# Patient Record
Sex: Female | Born: 1960
Health system: Southern US, Community
[De-identification: ages and names within clinical notes are randomized; demographics above are authoritative.]

## PROBLEM LIST (undated history)

## (undated) DIAGNOSIS — J310 Chronic rhinitis: Secondary | ICD-10-CM

## (undated) DIAGNOSIS — H548 Legal blindness, as defined in USA: Secondary | ICD-10-CM

## (undated) DIAGNOSIS — R51 Headache: Secondary | ICD-10-CM

## (undated) DIAGNOSIS — E7800 Pure hypercholesterolemia, unspecified: Secondary | ICD-10-CM

## (undated) DIAGNOSIS — G911 Obstructive hydrocephalus: Secondary | ICD-10-CM

## (undated) DIAGNOSIS — I10 Essential (primary) hypertension: Secondary | ICD-10-CM

## (undated) HISTORY — DX: Legal blindness, as defined in USA: H54.8

## (undated) HISTORY — DX: Chronic rhinitis: J31.0

## (undated) HISTORY — DX: Obstructive hydrocephalus: G91.1

## (undated) HISTORY — PX: ROTATOR CUFF REPAIR: SHX139

## (undated) HISTORY — PX: ANKLE FUSION: SHX881

## (undated) HISTORY — DX: Pure hypercholesterolemia, unspecified: E78.00

## (undated) HISTORY — DX: Essential (primary) hypertension: I10

## (undated) HISTORY — PX: OTHER SURGICAL HISTORY: SHX169

## (undated) HISTORY — DX: Headache: R51

---

## 1998-08-30 ENCOUNTER — Emergency Department (HOSPITAL_COMMUNITY): Admission: EM | Admit: 1998-08-30 | Discharge: 1998-08-30 | Payer: Self-pay | Admitting: Emergency Medicine

## 2002-01-26 ENCOUNTER — Emergency Department (HOSPITAL_COMMUNITY): Admission: EM | Admit: 2002-01-26 | Discharge: 2002-01-26 | Payer: Self-pay | Admitting: Emergency Medicine

## 2002-01-26 ENCOUNTER — Encounter: Payer: Self-pay | Admitting: Emergency Medicine

## 2006-09-13 ENCOUNTER — Emergency Department (HOSPITAL_COMMUNITY): Admission: EM | Admit: 2006-09-13 | Discharge: 2006-09-14 | Payer: Self-pay | Admitting: Emergency Medicine

## 2007-08-03 ENCOUNTER — Encounter: Admission: RE | Admit: 2007-08-03 | Discharge: 2007-08-03 | Payer: Self-pay | Admitting: Orthopedic Surgery

## 2007-09-08 ENCOUNTER — Inpatient Hospital Stay (HOSPITAL_COMMUNITY): Admission: RE | Admit: 2007-09-08 | Discharge: 2007-09-11 | Payer: Self-pay | Admitting: Orthopedic Surgery

## 2008-02-10 ENCOUNTER — Encounter: Admission: RE | Admit: 2008-02-10 | Discharge: 2008-02-10 | Payer: Self-pay | Admitting: Family Medicine

## 2008-02-22 ENCOUNTER — Encounter: Admission: RE | Admit: 2008-02-22 | Discharge: 2008-05-22 | Payer: Self-pay | Admitting: Family Medicine

## 2009-04-10 ENCOUNTER — Encounter: Admission: RE | Admit: 2009-04-10 | Discharge: 2009-04-10 | Payer: Self-pay | Admitting: Family Medicine

## 2009-04-15 ENCOUNTER — Encounter: Admission: RE | Admit: 2009-04-15 | Discharge: 2009-04-15 | Payer: Self-pay | Admitting: Family Medicine

## 2009-04-17 ENCOUNTER — Other Ambulatory Visit: Admission: RE | Admit: 2009-04-17 | Discharge: 2009-04-17 | Payer: Self-pay | Admitting: Family Medicine

## 2009-10-18 ENCOUNTER — Encounter: Admission: RE | Admit: 2009-10-18 | Discharge: 2009-10-18 | Payer: Self-pay | Admitting: Family Medicine

## 2009-11-04 ENCOUNTER — Encounter: Admission: RE | Admit: 2009-11-04 | Discharge: 2009-11-04 | Payer: Self-pay | Admitting: Otolaryngology

## 2010-02-16 ENCOUNTER — Encounter
Admission: RE | Admit: 2010-02-16 | Discharge: 2010-02-16 | Payer: Self-pay | Source: Home / Self Care | Attending: Orthopedic Surgery | Admitting: Orthopedic Surgery

## 2010-02-23 ENCOUNTER — Encounter: Payer: Self-pay | Admitting: Otolaryngology

## 2010-02-24 ENCOUNTER — Other Ambulatory Visit: Payer: Self-pay | Admitting: Family Medicine

## 2010-02-24 ENCOUNTER — Encounter: Payer: Self-pay | Admitting: Orthopedic Surgery

## 2010-02-24 DIAGNOSIS — N6320 Unspecified lump in the left breast, unspecified quadrant: Secondary | ICD-10-CM

## 2010-03-19 ENCOUNTER — Ambulatory Visit (HOSPITAL_BASED_OUTPATIENT_CLINIC_OR_DEPARTMENT_OTHER)
Admission: RE | Admit: 2010-03-19 | Discharge: 2010-03-19 | Disposition: A | Discharge status: Home / Self Care | Payer: Medicare Other | Source: Ambulatory Visit | Attending: Orthopedic Surgery | Admitting: Orthopedic Surgery

## 2010-03-19 DIAGNOSIS — Z01812 Encounter for preprocedural laboratory examination: Secondary | ICD-10-CM | POA: Insufficient documentation

## 2010-03-19 DIAGNOSIS — M719 Bursopathy, unspecified: Secondary | ICD-10-CM | POA: Insufficient documentation

## 2010-03-19 DIAGNOSIS — M24119 Other articular cartilage disorders, unspecified shoulder: Secondary | ICD-10-CM | POA: Insufficient documentation

## 2010-03-19 DIAGNOSIS — M19019 Primary osteoarthritis, unspecified shoulder: Secondary | ICD-10-CM | POA: Insufficient documentation

## 2010-03-19 DIAGNOSIS — Z01810 Encounter for preprocedural cardiovascular examination: Secondary | ICD-10-CM | POA: Insufficient documentation

## 2010-03-19 DIAGNOSIS — M67919 Unspecified disorder of synovium and tendon, unspecified shoulder: Secondary | ICD-10-CM | POA: Insufficient documentation

## 2010-03-21 NOTE — Op Note (Signed)
Stacy Hurst, Stacy Hurst              ACCOUNT NO.:  [id number]  MEDICAL RECORD NO.:  [id number]           PATIENT TYPE:  AME  LOCATION:                               FACILITY:  [facility]  PHYSICIAN:  Vara Mairena L. Rendall, M.D.  DATE OF BIRTH:  07/18/1960  DATE OF PROCEDURE:  03/19/2010 DATE OF DISCHARGE:                              OPERATIVE REPORT   PREOPERATIVE DIAGNOSES: 1. Right shoulder rotator cuff tear with downward sloping acromion, AC     joint arthritis, and degenerative labrum. 2. Morbid obesity.  SURGICAL PROCEDURE: 1. Arthroscopic glenohumeral debridement of labrum and portions of     cuff tears. 2. Open acromioplasty and repair of rotator cuff tear with also open     distal clavicle resection.  POSTOPERATIVE DIAGNOSES: 1. Right shoulder rotator cuff tear with downward sloping acromion, AC     joint arthritis, and degenerative labrum. 2. Morbid obesity.  SURGEON:  Soyla Bainter L. Rendall, MD  ASSISTANT:  Legrand Pitts. Duffy, PAC, present and participated in entire procedure.  PATHOLOGY:  The patient is morbidly obese, weighing 330 pounds at 5 feet 3 inches.  The weight of the arm and the fat between skin and joint was a significant obstacle throughout the procedure.  At surgery, the findings were a supraspinatus tear at its insertion with seriously downward sloping acromion.  She had poor muscle definition and weak deltoid and weak supraspinatus.  She had a supraspinatus outlet that was seriously impinged by the [facility] joint on both the acromial and the clavicular side.  Even after the acromioplasty and after the distal clavicle resection, it was necessary to bur a quarter inch of acromion in the region of the [facility] joint to be able to stick my assistants small finger in the interval between the supraspinatus and the acromion.  PROCEDURE IN DETAIL:  Under general anesthesia, the patient is placed semi-sitting on the regular OR table with a bolster under the right shoulder.  Because of  her size, it was necessary to do the shoulder scope in a sitting position, aiming the scope upward.  After routine prepping and draping, findings were degenerative labrum, tears of the supraspinatus, and also partial fraying of the subscapularis.  An anterior working portal was obtained and the labrum was debrided bit of the biceps tendon which was frayed, was debrided, and some of the supraspinatus and subscapularis were debrided.  Once this was completed, attention was turned to the rotator cuff tear.  An approximate 3 inch incision was made and dissection was carried down 2 inches to the fascia.  Two cerebellars were required.  The anterior leading edge of the acromion was not easily identified and the deltoid was taken down on the lateral two-thirds of the acromion, starting at the anterior portion.  Then, it went around the front an additional centimeter.  With this working space, an acromioplasty was carried out with osteotome and mallet.  The [facility] joint was found to be severely obstructing the supraspinatus notch.  It was necessary to proceed with the distal clavicle resection.  This was done by dissection bluntly over the fascia to the [facility] joint.  This was opened by subperiosteal dissection and 1 cm of the distal clavicle was removed with oscillating saw.  Bone wax was applied over this.  The fascia was then reapproximated with 0 Vicryl sutures closing the periosteal sleeve dorsally.  At this point, there was still significant obstruction of the supraspinatus and a 6 mm arthroscopy bur and its sheath was used to remove about a quarter inch of bone on the undersurface of the acromion near the [facility] joint.  This opened enough space for the tendon to traverse relatively easily.  Once this was completed, the rotator cuff was addressed, a bleeding bone bed was obtained at the greater tuberosity with half-inch osteotome and mallet.  #2 FiberWire was used and 2 stitches through bone  tunnels pulling the margins of the freshened bone to the bleeding bone bed, one was done anteriorly and an additional one posteriorly, and then a tweener suture as well.  This gave an excellent virtually watertight repair of the rotator cuff.  Once this was completed, the deltoid was re- attached to the acromion with #2 Ethibond sutures.  There was some difficulty in the stitch actually staying in the fascia of the deltoid as it was weak tissue.  However, by grabbing larger sections, the deltoid was re-attached through drill holes in bone made with towel clips.  With the deltoid reapproximated, the subcutaneous was closed in 2 layers, and the skin with Steri-Strips.  Operative time less than an hour and 30 minutes.  The patient tolerated the procedure well, but the procedure was made more difficult with constant struggling with the weight and position of the arm during key portions of the case.     Antoninette Lerner L. Priscille Kluver, M.D.     Renato Gails  D:  03/19/2010  T:  03/19/2010  Job:  [id number]  Electronically Signed by Erasmo Leventhal M.D. on 03/21/2010 01:23:51 PM

## 2010-06-17 NOTE — Op Note (Signed)
Stacy Hurst, Stacy Hurst              ACCOUNT NO.:  [id number]   MEDICAL RECORD NO.:  [id number]          PATIENT TYPE:  INP   LOCATION:  5033                         FACILITY:  [facility]   PHYSICIAN:  Nadara Mustard, MD     DATE OF BIRTH:  09/10/1960   DATE OF PROCEDURE:  09/08/2007  DATE OF DISCHARGE:                               OPERATIVE REPORT   PREOPERATIVE DIAGNOSIS:  Fibrous union, right ankle fusion and subtalar  arthrosis.   POSTOPERATIVE DIAGNOSIS:  Fibrous union, right ankle fusion and subtalar  arthrosis.   PROCEDURE:  1. Removal of deep retained hardware.  2. Tibiocalcaneal fusion with a Synthes nail 10 x 180 mm locked      distally.   SURGEON:  Nadara Mustard, MD   ANESTHESIA:  General plus popliteal block.   ESTIMATED BLOOD LOSS:  300 mL   ANTIBIOTICS:  Kefzol 2 grams.   DRAINS:  None.   COMPLICATIONS:  None.   TOURNIQUET TIME:  None.   DISPOSITION:  To PACU in stable condition.   INDICATIONS FOR PROCEDURE:  The patient is a 50 year old woman who is  status post right ankle fusion for osteoarthritis.  The patient had  developed a progressive fibrous union and had developed further  arthritis in the subtalar joint due to her progressive fibrous union and  subtalar arthrosis.  The patient presents at this time for  tibiocalcaneal fusion.  Risks and benefits were discussed including  infection, neurovascular injury, persistent pain, and need for  additional surgery.  The patient states she understands and wished to  proceed at this time.   DESCRIPTION OF PROCEDURE:  The patient was brought to OR room 1 and  under underwent a popliteal and general anesthetic.  After adequate  level of anesthesia was obtained, the patient was placed in the left  lateral decubitus position with the right side up and the right lower  extremity was prepped using DuraPrep and draped into a sterile field.  A  incision was made medially longitudinally over the medial malleolus.  This was carried down to the retained hardware and the deep retained  hardware was removed.  A separate incision was made laterally along the  fibula.  The fibular distal aspect was removed.  The foot was held at 90  degrees of dorsiflexion and cuts were made at the distal tibia and talar  dome to ensure 90-degree alignment of the ankle.  The subtalar joint was  debrided.  A separate incision was then made in the plantar aspect and a  guidewire was inserted from the calcaneus into the tibia.  The distal  calcaneus was reamed.  A guidewire was inserted from the calcaneus into  the tibial shaft and this was sequentially reamed to 11 mm for a 10-mm  nail.  The 10-mm nail was inserted.  C-arm fluoroscopy verified  reduction.  The spiral blade was attempted to be inserted distally,  however, the new external alignment jig did not provide proper  alignment.  So therefore, the spiral blade was not inserted and a 6 mm x  68 mm distal interlocking  screw was used to lock the nail.  This had  good purchase.  C-arm fluoroscopy verified reduction.  The alignment was  checked.  There was no internal or external rotation of the foot on the  tibia.  The wounds were irrigated.  The bone graft from the fibula was  morselized and was packed locally along the ankle and subtalar joint.  The incisions were closed using 2-0 nylon.  The wounds were covered with  Adaptic, orthopedic sponges, ABD dressing, Webril, and Coban.  The  patient was extubated and taken to PACU in stable condition.      Nadara Mustard, MD  Electronically Signed     MVD/MEDQ  D:  09/08/2007  T:  09/08/2007  Job:  [id number]

## 2010-06-20 NOTE — Discharge Summary (Signed)
NAMEDORCUS, RIGA              ACCOUNT NO.:  [id number]   MEDICAL RECORD NO.:  [id number]          PATIENT TYPE:  INP   LOCATION:  5033                         FACILITY:  [facility]   PHYSICIAN:  Nadara Mustard, MD     DATE OF BIRTH:  01-Nov-1960   DATE OF ADMISSION:  09/08/2007  DATE OF DISCHARGE:  09/11/2007                               DISCHARGE SUMMARY   FINAL DIAGNOSIS:  Fibrous union right ankle and subtalar fusion.   PROCEDURE:  Removal of deep hardware with tibial calcaneal fusion with a  Synthes nail.  Discharged to home in stable condition.  Touch down  weight bearing on the right.  Follow up with Dr. Lajoyce Corners in 1 week.   HISTORY OF PRESENT ILLNESS:  The patient is a 50 year old woman, status  post tibial calcaneal fusion, developed a fibrous union.  The patient  has had pain with activities of daily living and presents at this time  for revision surgery.  The patient's hospital course is essentially  unremarkable.  She underwent removal of deep hardware and tibial  calcaneal fusion on September 08, 2007.  She received Kefzol for infection  prophylaxis.  A tourniquet was not used.  She progressed well with her  physical therapy.  She received Kefzol for infection prophylaxis.  Her  foot was neurovascularly intact and she was discharged to home in stable  condition on September 11, 2007, with follow up in the office in 1 week.      Nadara Mustard, MD  Electronically Signed     MVD/MEDQ  D:  10/06/2007  T:  10/06/2007  Job:  [id number]

## 2010-08-18 ENCOUNTER — Ambulatory Visit
Admission: RE | Admit: 2010-08-18 | Discharge: 2010-08-18 | Disposition: A | Discharge status: Home / Self Care | Payer: Medicare Other | Source: Ambulatory Visit | Attending: Family Medicine | Admitting: Family Medicine

## 2010-08-18 DIAGNOSIS — N6320 Unspecified lump in the left breast, unspecified quadrant: Secondary | ICD-10-CM

## 2010-10-31 LAB — COMPREHENSIVE METABOLIC PANEL
CO2: 30
Calcium: 9.8
Creatinine, Ser: 0.88
GFR calc non Af Amer: >60
Glucose, Bld: 112 — ABNORMAL HIGH

## 2010-10-31 LAB — CBC
Hemoglobin: 13.4
MCHC: 33.6
MCV: 90.9
RBC: 4.38

## 2011-08-12 ENCOUNTER — Other Ambulatory Visit: Payer: Self-pay | Admitting: Family Medicine

## 2011-08-12 DIAGNOSIS — Z1231 Encounter for screening mammogram for malignant neoplasm of breast: Secondary | ICD-10-CM

## 2011-08-26 ENCOUNTER — Ambulatory Visit
Admission: RE | Admit: 2011-08-26 | Discharge: 2011-08-26 | Disposition: A | Discharge status: Home / Self Care | Payer: Medicare Other | Source: Ambulatory Visit | Attending: Family Medicine | Admitting: Family Medicine

## 2011-08-26 DIAGNOSIS — Z1231 Encounter for screening mammogram for malignant neoplasm of breast: Secondary | ICD-10-CM

## 2012-05-31 ENCOUNTER — Other Ambulatory Visit (HOSPITAL_COMMUNITY)
Admission: RE | Admit: 2012-05-31 | Discharge: 2012-05-31 | Disposition: A | Discharge status: Home / Self Care | Payer: Medicare Other | Source: Ambulatory Visit | Attending: Family Medicine | Admitting: Family Medicine

## 2012-05-31 ENCOUNTER — Other Ambulatory Visit: Payer: Self-pay | Admitting: Family Medicine

## 2012-05-31 DIAGNOSIS — Z1151 Encounter for screening for human papillomavirus (HPV): Secondary | ICD-10-CM | POA: Insufficient documentation

## 2012-05-31 DIAGNOSIS — Z124 Encounter for screening for malignant neoplasm of cervix: Secondary | ICD-10-CM | POA: Insufficient documentation

## 2012-07-15 ENCOUNTER — Other Ambulatory Visit: Payer: Self-pay

## 2012-07-15 DIAGNOSIS — Z1231 Encounter for screening mammogram for malignant neoplasm of breast: Secondary | ICD-10-CM

## 2012-07-21 ENCOUNTER — Other Ambulatory Visit: Payer: Self-pay

## 2012-07-21 ENCOUNTER — Ambulatory Visit
Admission: RE | Admit: 2012-07-21 | Discharge: 2012-07-21 | Disposition: A | Discharge status: Home / Self Care | Payer: Medicare Other | Source: Ambulatory Visit

## 2012-07-21 DIAGNOSIS — T1490XA Injury, unspecified, initial encounter: Secondary | ICD-10-CM

## 2012-07-21 DIAGNOSIS — R52 Pain, unspecified: Secondary | ICD-10-CM

## 2012-08-29 ENCOUNTER — Ambulatory Visit: Payer: Medicare Other

## 2012-09-12 ENCOUNTER — Ambulatory Visit
Admission: RE | Admit: 2012-09-12 | Discharge: 2012-09-12 | Disposition: A | Discharge status: Home / Self Care | Payer: Self-pay | Source: Ambulatory Visit

## 2012-09-12 DIAGNOSIS — Z1231 Encounter for screening mammogram for malignant neoplasm of breast: Secondary | ICD-10-CM

## 2013-03-16 ENCOUNTER — Ambulatory Visit
Admission: RE | Admit: 2013-03-16 | Discharge: 2013-03-16 | Disposition: A | Discharge status: Home / Self Care | Payer: Medicare Other | Source: Ambulatory Visit | Attending: Family Medicine | Admitting: Family Medicine

## 2013-03-16 ENCOUNTER — Other Ambulatory Visit: Payer: Self-pay | Admitting: Family Medicine

## 2013-03-16 DIAGNOSIS — J329 Chronic sinusitis, unspecified: Secondary | ICD-10-CM

## 2013-07-04 ENCOUNTER — Ambulatory Visit: Payer: Medicare Other | Admitting: Neurology

## 2013-07-04 ENCOUNTER — Encounter: Payer: Self-pay | Admitting: *Deleted

## 2013-07-05 ENCOUNTER — Encounter: Payer: Self-pay | Admitting: Neurology

## 2013-07-05 ENCOUNTER — Ambulatory Visit (INDEPENDENT_AMBULATORY_CARE_PROVIDER_SITE_OTHER): Payer: Medicare Other | Admitting: Neurology

## 2013-07-05 ENCOUNTER — Encounter (INDEPENDENT_AMBULATORY_CARE_PROVIDER_SITE_OTHER): Payer: Self-pay

## 2013-07-05 VITALS — BP 134/69 | HR 70 | Ht 64.5 in | Wt 331.0 lb

## 2013-07-05 DIAGNOSIS — G911 Obstructive hydrocephalus: Secondary | ICD-10-CM

## 2013-07-05 DIAGNOSIS — G43009 Migraine without aura, not intractable, without status migrainosus: Secondary | ICD-10-CM

## 2013-07-05 DIAGNOSIS — G919 Hydrocephalus, unspecified: Secondary | ICD-10-CM

## 2013-07-05 MED ORDER — TOPIRAMATE 25 MG PO TABS: 25.0000 mg | ORAL_TABLET | Freq: Two times a day (BID) | ORAL | Status: DC

## 2013-07-05 NOTE — Progress Notes (Signed)
[facility]    Provider:  Dr Janann Colonel Referring Provider: Jolene Provost, PA-C Primary Care Physician:  Lilian Coma, MD  CC:  headaches  HPI:  Stacy Hurst is a 53 y.o. female here as a referral from Dr. Sallee Provencal for frequent headaches  States she has had headaches her whole life. Was born with hydrocephalus, had shunt placed at 4yoa with revision in 1994 at [facility]. Shunt was last evaluated in 2014 and was working well. Headache is described as a severe pounding pain over her left side. Pain gets up to a 7/10 at its worst. Will have a headache like this 3 to 4 times a week. Can last 8 hours to all day long. + Nausea and emesis. + photophobia. No change in vision. No focal motor or sensory changes. + Dizziness and light headed sensation with the headaches. Thinks she may have been dizzy lately which resulted in a fall causing a fracture of her right foot. She notes being told she has papilledema in both eyes, this is a chronic problem. Has some difficulty with her vision, this is checked on a yearly basis and it has been stable. She reports her headaches have been ongoing for years, current headaches are not different from headaches in the past, does not feel headaches are getting progressively worse. Currently taking Atenolol daily for headache, uses Imitrex as needed, using <2-3 times per month.   Has been extensively evaluated by ENT for possible sinus headaches. They do not feel she is suffering from sinus headaches. No history of glaucoma or kidney stones.   Review of Systems: Out of a complete 14 system review, the patient complains of only the following symptoms, and all other reviewed systems are negative. + eye pain, feeling hot  History   Social History  . Marital Status: Single    Spouse Name: N/A    Number of Children: N/A  . Years of Education: N/A   Occupational History  . Not on file.   Social History Main Topics  . Smoking status:  Never Smoker   . Smokeless tobacco: Never Used  . Alcohol Use: No  . Drug Use: No  . Sexual Activity: Not on file   Other Topics Concern  . Not on file   Social History Narrative   Single, no children   Left handed   10 th grade   48 oz diet coke daily 16 oz tea occ    Family History  Problem Relation Age of Onset  . Hypertension Sister     Past Medical History  Diagnosis Date  . Unspecified essential hypertension   . Obstructive hydrocephalus   . Pure hypercholesterolemia   . Headache(784.0)   . Chronic rhinitis   . Legal blindness, as defined in [place]     No past surgical history on file.  Current Outpatient Prescriptions  Medication Sig Dispense Refill  . aspirin 81 MG tablet Take 81 mg by mouth daily.      Marland Kitchen atenolol (TENORMIN) 25 MG tablet Take 25 mg by mouth daily.      Marland Kitchen buPROPion (WELLBUTRIN XL) 300 MG 24 hr tablet Take 300 mg by mouth daily.      . cetirizine (ZYRTEC) 10 MG tablet Take 10 mg by mouth daily.      Marland Kitchen FLUoxetine (PROZAC) 40 MG capsule Take 40 mg by mouth daily.      . hydrochlorothiazide (HYDRODIURIL) 25 MG tablet Take 25 mg by mouth daily.      Marland Kitchen  mometasone (NASONEX) 50 MCG/ACT nasal spray Place 2 sprays into the nose daily.      . Olopatadine HCl (PATANASE) 0.6 % SOLN Place into the nose.      . pseudoephedrine (SUDAFED) 30 MG tablet Take 30 mg by mouth every 4 (four) hours as needed for congestion.      . rosuvastatin (CRESTOR) 10 MG tablet Take 10 mg by mouth daily.      . SUMAtriptan (IMITREX) 100 MG tablet Take 100 mg by mouth every 2 (two) hours as needed for migraine or headache. May repeat in 2 hours if headache persists or recurs.       No current facility-administered medications for this visit.    Allergies as of 07/05/2013 - Review Complete 07/05/2013  Allergen Reaction Noted  . Sulfa antibiotics  07/04/2013    Vitals: BP 134/69  Pulse 70  Ht 5' 4.5" (1.638 m)  Wt 331 lb (150.141 kg)  BMI 55.96 kg/m2 Last Weight:  Wt  Readings from Last 1 Encounters:  07/05/13 331 lb (150.141 kg)   Last Height:   Ht Readings from Last 1 Encounters:  07/05/13 5' 4.5" (1.638 m)     Physical exam: Exam: Gen: NAD, conversant Eyes: anicteric sclerae, moist conjunctivae HENT: Atraumatic, oropharynx clear Neck: Trachea midline; supple,  Lungs: CTA, no wheezing, rales, rhonic                          CV: RRR, no MRG Abdomen: Soft, non-tender;  Extremities: No peripheral edema  Skin: Normal temperature, no rash,  Psych: Appropriate affect, pleasant  Neuro: MS: AA&Ox3, appropriately interactive, normal affect   Attention: WORLD backwards  Speech: fluent w/o paraphasic error  Memory: good recent and remote recall  CN: PERRL, EOMI no nystagmus, mild bilateral optic disc edema, decreased VFF L inferior quadrant, no ptosis, sensation intact to LT V1-V3 bilat, face symmetric, no weakness, hearing grossly intact, palate elevates symmetrically, shoulder shrug 5/5 bilat,  tongue protrudes midline, no fasiculations noted.  Motor: normal bulk and tone Strength: 5/5  In all extremities  Coord: rapid alternating and point-to-point (FNF, HTS) movements intact.  Reflexes: symmetrical, bilat downgoing toes  Sens: LT intact in all extremities  Gait: posture, stance, stride and arm-swing normal. Tandem gait intact. Able to walk on heels and toes. Romberg absent.   Assessment:  After physical and neurologic examination, review of laboratory studies, imaging, neurophysiology testing and pre-existing records, assessment will be reviewed on the problem list.  Plan:  Treatment plan and additional workup will be reviewed under Problem List.  1)Headache 2)Hydrocephalus s/p VP shunt  53y/o woman presenting for initial evaluation of headache. Suspect her headaches are likely multifactorial but based on description she does appear to be suffering from migraine without aura. Will start Topamax 25mg  BID, can titrate up as  tolerated. She is scheduled to follow up with her eye doctor next week, unclear if her VF deficits are a chronic change. Have low suspicion that her hydrocephalus is contributing to her symptoms but counseled her to have routine follow up with her neurosurgeon to evaluate the shunt. Follow up as needed.   Jim Like, DO  [facility] [address] [place],  [address]  Phone [phone] Fax [phone]

## 2013-07-05 NOTE — Patient Instructions (Signed)
Overall you are doing fairly well but I do want to suggest a few things today:   Remember to drink plenty of fluid, eat healthy meals and do not skip any meals. Try to eat protein with a every meal and eat a healthy snack such as fruit or nuts in between meals. Try to keep a regular sleep-wake schedule and try to exercise daily, particularly in the form of walking, 20-30 minutes a day, if you can.   As far as your medications are concerned, I would like to suggest the following:  1)Please start taking Topamax 25mg  twice a day  Please follow up with your eye doctor and neurosurgeon to have the shunt evaluated.  Follow up as needed. Please call us with any interim questions, concerns, problems, updates or refill requests.   My clinical assistant and will answer any of your questions and relay your messages to me and also relay most of my messages to you.   Our phone number is [phone]. We also have an after hours call service for urgent matters and there is a physician on-call for urgent questions. For any emergencies you know to call 911 or go to the nearest emergency room

## 2013-07-12 ENCOUNTER — Ambulatory Visit: Payer: Medicare Other | Admitting: Neurology

## 2013-08-09 ENCOUNTER — Other Ambulatory Visit: Payer: Self-pay

## 2013-08-09 DIAGNOSIS — Z1231 Encounter for screening mammogram for malignant neoplasm of breast: Secondary | ICD-10-CM

## 2013-08-29 ENCOUNTER — Other Ambulatory Visit: Payer: Self-pay | Admitting: Family Medicine

## 2013-08-29 DIAGNOSIS — E041 Nontoxic single thyroid nodule: Secondary | ICD-10-CM

## 2013-08-31 ENCOUNTER — Emergency Department (HOSPITAL_COMMUNITY)
Admission: EM | Admit: 2013-08-31 | Discharge: 2013-08-31 | Disposition: A | Discharge status: Home / Self Care | Payer: Medicare Other | Attending: Emergency Medicine | Admitting: Emergency Medicine

## 2013-08-31 ENCOUNTER — Encounter (HOSPITAL_COMMUNITY): Payer: Self-pay | Admitting: Emergency Medicine

## 2013-08-31 ENCOUNTER — Emergency Department (HOSPITAL_COMMUNITY): Payer: Medicare Other

## 2013-08-31 DIAGNOSIS — IMO0002 Reserved for concepts with insufficient information to code with codable children: Secondary | ICD-10-CM | POA: Diagnosis not present

## 2013-08-31 DIAGNOSIS — H548 Legal blindness, as defined in USA: Secondary | ICD-10-CM | POA: Insufficient documentation

## 2013-08-31 DIAGNOSIS — R55 Syncope and collapse: Secondary | ICD-10-CM | POA: Diagnosis present

## 2013-08-31 DIAGNOSIS — R51 Headache: Secondary | ICD-10-CM | POA: Insufficient documentation

## 2013-08-31 DIAGNOSIS — Z79899 Other long term (current) drug therapy: Secondary | ICD-10-CM | POA: Insufficient documentation

## 2013-08-31 DIAGNOSIS — Z982 Presence of cerebrospinal fluid drainage device: Secondary | ICD-10-CM | POA: Insufficient documentation

## 2013-08-31 DIAGNOSIS — E7800 Pure hypercholesterolemia, unspecified: Secondary | ICD-10-CM | POA: Diagnosis not present

## 2013-08-31 DIAGNOSIS — I10 Essential (primary) hypertension: Secondary | ICD-10-CM | POA: Insufficient documentation

## 2013-08-31 DIAGNOSIS — R519 Headache, unspecified: Secondary | ICD-10-CM

## 2013-08-31 DIAGNOSIS — R200 Anesthesia of skin: Secondary | ICD-10-CM

## 2013-08-31 DIAGNOSIS — R209 Unspecified disturbances of skin sensation: Secondary | ICD-10-CM | POA: Insufficient documentation

## 2013-08-31 DIAGNOSIS — Z7982 Long term (current) use of aspirin: Secondary | ICD-10-CM | POA: Insufficient documentation

## 2013-08-31 LAB — BASIC METABOLIC PANEL
ANION GAP: 15 (ref 5–15)
BUN: 7 mg/dL (ref 6–23)
CALCIUM: 9.5 mg/dL (ref 8.4–10.5)
CO2: 24 meq/L (ref 19–32)
CREATININE: 0.89 mg/dL (ref 0.50–1.10)
Chloride: 103 mEq/L (ref 96–112)
GFR calc Af Amer: 86 mL/min — ABNORMAL LOW (ref >90)
GFR, EST NON AFRICAN AMERICAN: 74 mL/min — AB (ref >90)
Glucose, Bld: 95 mg/dL (ref 70–99)
Potassium: 3.7 mEq/L (ref 3.7–5.3)
SODIUM: 142 meq/L (ref 137–147)

## 2013-08-31 LAB — CBC
HCT: 38.9 % (ref 36.0–46.0)
Hemoglobin: 13.1 g/dL (ref 12.0–15.0)
MCH: 32.3 pg (ref 26.0–34.0)
MCHC: 33.7 g/dL (ref 30.0–36.0)
MCV: 95.8 fL (ref 78.0–100.0)
PLATELETS: 254 10*3/uL (ref 150–400)
RBC: 4.06 MIL/uL (ref 3.87–5.11)
RDW: 13.9 % (ref 11.5–15.5)
WBC: 7.4 10*3/uL (ref 4.0–10.5)

## 2013-08-31 LAB — I-STAT TROPONIN, ED: TROPONIN I, POC: 0 ng/mL (ref 0.00–0.08)

## 2013-08-31 MED ORDER — PROCHLORPERAZINE MALEATE 10 MG PO TABS: 10.0000 mg | ORAL_TABLET | Freq: Two times a day (BID) | ORAL | Status: AC | PRN

## 2013-08-31 MED ORDER — ONDANSETRON 4 MG PO TBDP: 4.0000 mg | ORAL_TABLET | Freq: Once | ORAL | Status: DC

## 2013-08-31 MED ORDER — PROCHLORPERAZINE EDISYLATE 5 MG/ML IJ SOLN
10.0000 mg | Freq: Four times a day (QID) | INTRAMUSCULAR | Status: DC | PRN
  Administered 2013-08-31: 10 mg via INTRAVENOUS
  Filled 2013-08-31: qty 2

## 2013-08-31 MED ORDER — SODIUM CHLORIDE 0.9 % IV SOLN
INTRAVENOUS | Status: DC
  Administered 2013-08-31: 75 mL/h via INTRAVENOUS

## 2013-08-31 MED ORDER — MORPHINE SULFATE 4 MG/ML IJ SOLN
4.0000 mg | Freq: Once | INTRAMUSCULAR | Status: DC
  Filled 2013-08-31: qty 1

## 2013-08-31 MED ORDER — PROMETHAZINE HCL 25 MG/ML IJ SOLN
25.0000 mg | Freq: Once | INTRAMUSCULAR | Status: AC
  Administered 2013-08-31: 25 mg via INTRAVENOUS
  Filled 2013-08-31: qty 1

## 2013-08-31 NOTE — ED Notes (Signed)
IV team paged; 2nd RN's attempts unsuccessful.

## 2013-08-31 NOTE — ED Notes (Signed)
Pt and family expressing concerns about being discharged. EDPA aware.

## 2013-08-31 NOTE — ED Notes (Signed)
IV team at bedside 

## 2013-08-31 NOTE — ED Notes (Signed)
EDPA student at bedside.

## 2013-08-31 NOTE — ED Notes (Signed)
Patient transported to CT 

## 2013-08-31 NOTE — ED Notes (Signed)
Pt states last Sunday was near syncopal.  Pt reports continuous dizziness, nausea, left arm/hand is numb.  Pt has shunt and on left sided. Reports pounding headache over left eye

## 2013-08-31 NOTE — ED Provider Notes (Signed)
CSN: [id number]     Arrival date & time 08/31/13  1559 History   First MD Initiated Contact with Patient 08/31/13 1751     Chief Complaint  Patient presents with  . Near Syncope  . Dizziness  . Hypotension     (Consider location/radiation/quality/duration/timing/severity/associated sxs/prior Treatment) HPI  Patient to the ER with complaints of near syncope episode this past Sunday (4 days ago) with continuous headache since then. She is also concerned about left hand numb and hand numbness. She has tremors at baseline. The patient was born with hydrocephalus and has had two shunts placed, the second time was in 1994. She has had no problems since then. She is having nausea, left sided hand pain and is concerned that her headaches are due to malfunction of her shunt. She has seen after her near syncope at an [facility] in [place]. Had a head CT done which was baseline. She saw her PCP today who told her to come to the ER for further evaluation because of concerns that she continues to have headaches and wanted her shunt re-evaluated. She has had no other episodes of near syncope. No fevers, vomiting, diarrhea, chest pain, coughing, sore throat, rash, vaginal discharge.  Past Medical History  Diagnosis Date  . Unspecified essential hypertension   . Obstructive hydrocephalus   . Pure hypercholesterolemia   . Headache(784.0)   . Chronic rhinitis   . Legal blindness, as defined in [place]    Past Surgical History  Procedure Laterality Date  . Shunt surgery    . Rotator cuff repair    . Ankle fusion     Family History  Problem Relation Age of Onset  . Hypertension Sister    History  Substance Use Topics  . Smoking status: Never Smoker   . Smokeless tobacco: Never Used  . Alcohol Use: No   OB History   Grav Para Term Preterm Abortions TAB SAB Ect Mult Living                 Review of Systems   Review of Systems  Gen: no weight loss, fevers, chills, night sweats  Eyes: no  discharge or drainage, no occular pain or visual changes  Nose: no epistaxis or rhinorrhea  Mouth: no dental pain, no sore throat  Neck: no neck pain  Lungs:No wheezing or hemoptysis No coughing CV:  No palpitations, dependent edema or orthopnea. No chest pain Abd: no diarrhea. No nausea or vomiting, No abdominal pain  GU: no dysuria or gross hematuria  MSK:  No muscle weakness, No  pain Neuro: + headache, no focal neurologic deficits, + near syncope Skin: no rash , no wounds Psyche: no complaints    Allergies  Sulfa antibiotics  Home Medications   Prior to Admission medications   Medication Sig Start Date End Date Taking? Authorizing Provider  aspirin 81 MG tablet Take 81 mg by mouth daily.   Yes Historical Provider, MD  atenolol (TENORMIN) 25 MG tablet Take 25 mg by mouth daily.   Yes Historical Provider, MD  buPROPion (WELLBUTRIN XL) 300 MG 24 hr tablet Take 300 mg by mouth daily.   Yes Historical Provider, MD  cetirizine (ZYRTEC) 10 MG tablet Take 10 mg by mouth daily.   Yes Historical Provider, MD  FLUoxetine (PROZAC) 40 MG capsule Take 40 mg by mouth daily.   Yes Historical Provider, MD  hydrochlorothiazide (HYDRODIURIL) 25 MG tablet Take 25 mg by mouth daily.   Yes Historical Provider, MD  mometasone (  NASONEX) 50 MCG/ACT nasal spray Place 2 sprays into the nose daily.   Yes Historical Provider, MD  pseudoephedrine (SUDAFED) 30 MG tablet Take 30 mg by mouth every 4 (four) hours as needed for congestion.   Yes Historical Provider, MD  rosuvastatin (CRESTOR) 10 MG tablet Take 10 mg by mouth daily.   Yes Historical Provider, MD  SUMAtriptan (IMITREX) 100 MG tablet Take 100 mg by mouth every 2 (two) hours as needed for migraine or headache. May repeat in 2 hours if headache persists or recurs.   Yes Historical Provider, MD  topiramate (TOPAMAX) 25 MG tablet Take 1 tablet (25 mg total) by mouth 2 (two) times daily. 07/05/13  Yes Hulen Luster, DO  Olopatadine HCl (PATANASE) 0.6  % SOLN Place into the nose.    Historical Provider, MD  prochlorperazine (COMPAZINE) 10 MG tablet Take 1 tablet (10 mg total) by mouth 2 (two) times daily as needed (for headache or nausea). 08/31/13   Monzerrat Wellen Marilu Favre, PA-C   BP 117/64  Pulse 81  Temp(Src) 98.3 F (36.8 C) (Oral)  Resp 15  SpO2 100%  LMP 04/24/2013 Physical Exam  Nursing note and vitals reviewed. Constitutional: She is oriented to person, place, and time. She appears well-developed and well-nourished. No distress.  HENT:  Head: Normocephalic and atraumatic.  Eyes: Pupils are equal, round, and reactive to light.  Neck: Normal range of motion. Neck supple.  Cardiovascular: Normal rate and regular rhythm.   Pulmonary/Chest: Effort normal.  Abdominal: Soft.  Neurological: She is alert and oriented to person, place, and time. She has normal strength. No cranial nerve deficit or sensory deficit. She displays a negative Romberg sign. GCS eye subscore is 4. GCS verbal subscore is 5. GCS motor subscore is 6.  Skin: Skin is warm and dry.    ED Course  Procedures (including critical care time) Labs Review Labs Reviewed  BASIC METABOLIC PANEL - Abnormal; Notable for the following:    GFR calc non Af Amer 74 (*)    GFR calc Af Amer 86 (*)    All other components within normal limits  CBC  I-STAT TROPOININ, ED    Imaging Review Dg Skull 1-3 Views  08/31/2013   CLINICAL DATA:  Headaches and dizziness. Ventriculoperitoneal shunt.  EXAM: SKULL - 1-3 VIEW  COMPARISON:  CT scan dated 11/04/2009  FINDINGS: Ventriculoperitoneal shunt is in place. The shunt appears to be intact. There are remnants of a previous right ventriculoperitoneal shunt. Craniotomy defect is noted on the left.  IMPRESSION: The current ventriculoperitoneal shunt appears intact and unchanged since 11/04/2009.   Electronically Signed   By: Rozetta Nunnery M.D.   On: 08/31/2013 21:24   Dg Chest 1 View  08/31/2013   CLINICAL DATA:  Headache and dizziness.   Ventriculoperitoneal shunt.  EXAM: CHEST - 1 VIEW  COMPARISON:  09/05/2007  FINDINGS: Ventriculoperitoneal shunt is noted across the left side of the chest. The shunt is intact. Heart and pulmonary vascularity are normal and the lungs are clear. There are remnants of a shunt on the right.  IMPRESSION: Left-sided shunt is intact.  Shunt remnants on the right.   Electronically Signed   By: Rozetta Nunnery M.D.   On: 08/31/2013 21:21   Dg Abd 1 View  08/31/2013   CLINICAL DATA:  Shunt series.  Headache.  EXAM: ABDOMEN - 1 VIEW  COMPARISON:  09/13/2006  FINDINGS: Ventricular peritoneal shunt tube is noted looping in the left side of the abdomen with the  tip in the upper mid pelvis. Bowel gas pattern is normal.  IMPRESSION: Benign appearing abdomen.  Shunt tube in place.   Electronically Signed   By: Rozetta Nunnery M.D.   On: 08/31/2013 21:20   Ct Head Wo Contrast  08/31/2013   CLINICAL DATA:  Headaches; previous shunt catheter placement  EXAM: CT HEAD WITHOUT CONTRAST  TECHNIQUE: Contiguous axial images were obtained from the base of the skull through the vertex without intravenous contrast.  COMPARISON:  February 10, 2008  FINDINGS: The patient has had a previous left frontoparietal craniotomy. There is a shunt catheter extending from the left frontal region with its tip in the right lateral ventricle, stable. A second shunt catheter placed from a right occipital approach has its tip medial to the atrium of the right lateral ventricle, stable. The ventricles are decompressed. There is no appreciable hemorrhage, extra-axial fluid collection, or midline shift. There is an apparent arachnoid cyst slightly superior and anterior to the third ventricle measuring 2.3 x 2.2 cm, stable. There is no other mass. There is evidence of prior infarct anterior to the lateral ventricles. There is no demonstrable corpus callosum on this study.  The bony calvarium appears intact except for postoperative defects. Mastoid air cells are  clear.  IMPRESSION: Stable study compared to 2010 examination. Shunt catheter tips are unchanged in position. The ventricles are decompressed. There is an apparent arachnoid cyst slightly superior anterior to the third ventricle, stable. There is no hemorrhage or mass effect. 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 There is evidence of a prior infarct anterior to the lateral ventricles. There is no new gray-white compartment lesion. No hemorrhage or extra-axial fluid. Postoperative bony defects appear stable.   Electronically Signed   By: Lowella Grip M.D.   On: 08/31/2013 20:40     EKG Interpretation None      MDM   Final diagnoses:  Nonintractable episodic headache, unspecified headache type  Numbness  Ventricular shunt in place   53 y.o.Stacy Hurst's  with back pain. No neurological deficits and normal neuro exam. Patient can walk. No loss of bowel or bladder control. No concern for cauda equina at this time base on HPI and physical exam findings. No fever, night sweats, weight loss, h/o cancer, IVDU. NO neck pain.  Work- up is unremarkable, pt given nausea medication, fluids and compazine. She was monitored in the ED. She is feeling significantly better. I discussed the case with Dr. Jeneen Rinks, she is having numbness in bilateral hands but no weakness. She has no neurological deficits. At  This time she would benefit from a neurology referral and work-up but from the ER standpoint Dr. Jeneen Rinks and I do not feel that further testing, work-up or treatment is needed.   53 y.o.Stacy Hurst's evaluation in the Emergency Department is complete. It has been determined that no acute conditions requiring further emergency intervention are present at this time. The patient/guardian have been advised of the diagnosis and plan. We have discussed signs and symptoms that warrant return to the ED, such as changes or worsening in symptoms.  Vital signs are stable at discharge. Filed Vitals:    08/31/13 2236  BP: 117/64  Pulse: 81  Temp:   Resp: 15    Patient/guardian has voiced understanding and agreed to follow-up with the PCP or specialist.   Linus Mako, PA-C 08/31/13 2336

## 2013-08-31 NOTE — Discharge Instructions (Signed)
Ventriculoperitoneal Shunt Placement The human brain makes  of a liter of water and reabsorbs it through drainage channels every day. If your brain's normal drainage channels are not working properly and water builds up in the brain, the water needs to be sent someplace else. This is when a little plastic tube called a ventriculoperitoneal (VP) shunt is used to let the water flow away from the brain and into a sack in the abdomen called the peritoneum. The peritoneum absorbs this fluid and gets rid of it. A VP shunt is a bypass of fluid from the ventricles of the brain to the peritoneum of the abdomen.  This drainage of fluid is necessary for many reasons. For example, a VP shunt needs to be placed:   In young infants who have a bleed or an abnormality in their brain (intraventricular hemorrhage).  In young adults after certain kinds of brain bleeds (subarrachnoid hemorrhage).  In young woman to prevent headaches and disturbance in their vision (pseudotumor cerebry).  In the elderly who have normal pressure but are getting confused, cannot walk, and have urinary incontinence (normal pressure hydrocephalous). LET YOUR CAREGIVER KNOW ABOUT:   Recent infections.  Any foreign objects in your body from a previous surgery.  Any recent fevers or illness.  Past medical history (such as diabetes, seizure disorder, or previous abdominal surgery).  Allergies.  Medicines taken including herbs, eyedrops, over-the-counter medicines, and creams.  Use of steroids (by mouth or creams).  Past problems with numbing medicines (anesthetics).  Possibility of pregnancy, if this applies.  History of blood clots.  History of bleeding or blood problems.  Past surgery.  Other health problems.  If you are experiencing changes in your vision.  If you are suffering from nausea.  If you have any recent onset of headaches. RISKS AND COMPLICATIONS  Bleeding. A large bleed in the brain is not very  common but can cause a great deal of damage.  Infection.  Injury to the structures in the neck, chest, abdomen, or bowel.  Hernia at the surgical cut (incision) site in the abdomen.  Shunt malfunction or blockage. Infection is a common cause of a shunt not working. BEFORE THE PROCEDURE   You will be given a medicine to help you sleep (general anesthetic), and a breathing tube will be placed.  You will be given antibiotic medications to help prevent infection.  Your head, neck, chest, and abdomen will be cleaned.  Before surgery, half of your head is shaved in the area where the shunt will be inserted. PROCEDURE   Once you are sleeping, a small whole is drilled in the skull for a plastic tube to be placed into one of the pockets of fluid (cerebrospinal fluid) called the ventricles. This tube is small.  A small incision is then made over the abdomen. Another plastic tube is passed under the skin of the head, neck, and chest. It is then passed through a hole into the belly.  The 2 tubes are connected to a plastic chamber that allows the surgeon to control the flow of water from the brain to the belly with a remote control. This is called a programmable shunt. AFTER THE PROCEDURE   You will most likely spend 24 to 48 hours in the hospital. During this time, your caregiver will look for any signs of complications from the procedure.  You will receive antibiotics for 24 hours.  Once you start passing gas, your caregiver will allow you to eat.  Once you  have started eating, walking, urinating, and having bowel movements on your own, and you are not having headaches, nausea, or vomiting, your caregiver may allow you to go home. Document Released: 01/01/2005 Document Revised: 04/13/2011 Document Reviewed: 08/24/2008 [facility] Patient Information 2015 [place], [place]. This information is not intended to replace advice given to you by your health care provider. Make sure you discuss any  questions you have with your health care provider. General Headache Without Cause A headache is pain or discomfort felt around the head or neck area. The specific cause of a headache may not be found. There are many causes and types of headaches. A few common ones are:  Tension headaches.  Migraine headaches.  Cluster headaches.  Chronic daily headaches. HOME CARE INSTRUCTIONS   Keep all follow-up appointments with your caregiver or any specialist referral.  Only take over-the-counter or prescription medicines for pain or discomfort as directed by your caregiver.  Lie down in a dark, quiet room when you have a headache.  Keep a headache journal to find out what may trigger your migraine headaches. For example, write down:  What you eat and drink.  How much sleep you get.  Any change to your diet or medicines.  Try massage or other relaxation techniques.  Put ice packs or heat on the head and neck. Use these 3 to 4 times per day for 15 to 20 minutes each time, or as needed.  Limit stress.  Sit up straight, and do not tense your muscles.  Quit smoking if you smoke.  Limit alcohol use.  Decrease the amount of caffeine you drink, or stop drinking caffeine.  Eat and sleep on a regular schedule.  Get 7 to 9 hours of sleep, or as recommended by your caregiver.  Keep lights dim if bright lights bother you and make your headaches worse. SEEK MEDICAL CARE IF:   You have problems with the medicines you were prescribed.  Your medicines are not working.  You have a change from the usual headache.  You have nausea or vomiting. SEEK IMMEDIATE MEDICAL CARE IF:   Your headache becomes severe.  You have a fever.  You have a stiff neck.  You have loss of vision.  You have muscular weakness or loss of muscle control.  You start losing your balance or have trouble walking.  You feel faint or pass out.  You have severe symptoms that are different from your first  symptoms. MAKE SURE YOU:   Understand these instructions.  Will watch your condition.  Will get help right away if you are not doing well or get worse. Document Released: 01/19/2005 Document Revised: 04/13/2011 Document Reviewed: 02/04/2011 [facility] Patient Information 2015 [place], [place]. This information is not intended to replace advice given to you by your health care provider. Make sure you discuss any questions you have with your health care provider.

## 2013-09-01 ENCOUNTER — Other Ambulatory Visit: Payer: Medicare Other

## 2013-09-01 ENCOUNTER — Ambulatory Visit
Admission: RE | Admit: 2013-09-01 | Discharge: 2013-09-01 | Disposition: A | Discharge status: Home / Self Care | Payer: Medicare Other | Source: Ambulatory Visit | Attending: Family Medicine | Admitting: Family Medicine

## 2013-09-01 DIAGNOSIS — E041 Nontoxic single thyroid nodule: Secondary | ICD-10-CM

## 2013-09-04 ENCOUNTER — Other Ambulatory Visit: Payer: Self-pay | Admitting: Family Medicine

## 2013-09-04 DIAGNOSIS — E041 Nontoxic single thyroid nodule: Secondary | ICD-10-CM

## 2013-09-05 ENCOUNTER — Other Ambulatory Visit (HOSPITAL_COMMUNITY)
Admission: RE | Admit: 2013-09-05 | Discharge: 2013-09-05 | Disposition: A | Discharge status: Home / Self Care | Payer: Medicare Other | Source: Ambulatory Visit | Attending: Interventional Radiology | Admitting: Interventional Radiology

## 2013-09-05 ENCOUNTER — Ambulatory Visit
Admission: RE | Admit: 2013-09-05 | Discharge: 2013-09-05 | Disposition: A | Discharge status: Home / Self Care | Payer: Medicare Other | Source: Ambulatory Visit | Attending: Family Medicine | Admitting: Family Medicine

## 2013-09-05 DIAGNOSIS — E041 Nontoxic single thyroid nodule: Secondary | ICD-10-CM

## 2013-09-05 NOTE — ED Provider Notes (Signed)
Medical screening examination/treatment/procedure(s) were performed by non-physician practitioner and as supervising physician I was immediately available for consultation/collaboration.   EKG Interpretation   Date/Time:  Thursday August 31 2013 16:21:44 EDT Ventricular Rate:  79 PR Interval:  156 QRS Duration: 80 QT Interval:  374 QTC Calculation: 428 R Axis:   146 Text Interpretation:  Normal sinus rhythm Right axis deviation Low voltage  QRS Cannot rule out Anterior infarct , age undetermined Abnormal ECG ED  PHYSICIAN INTERPRETATION AVAILABLE IN [facility] Confirmed by TEST,  Record ([id number]) on 09/02/2013 1:35:56 PM        Tanna Furry, MD 09/05/13 2351

## 2013-09-07 ENCOUNTER — Other Ambulatory Visit: Payer: Medicare Other

## 2013-09-09 ENCOUNTER — Emergency Department (HOSPITAL_COMMUNITY): Payer: Medicare Other

## 2013-09-09 ENCOUNTER — Encounter (HOSPITAL_COMMUNITY): Payer: Self-pay | Admitting: Emergency Medicine

## 2013-09-09 ENCOUNTER — Emergency Department (HOSPITAL_COMMUNITY)
Admission: EM | Admit: 2013-09-09 | Discharge: 2013-09-09 | Disposition: A | Discharge status: Home / Self Care | Payer: Medicare Other | Attending: Emergency Medicine | Admitting: Emergency Medicine

## 2013-09-09 DIAGNOSIS — R110 Nausea: Secondary | ICD-10-CM | POA: Diagnosis not present

## 2013-09-09 DIAGNOSIS — R42 Dizziness and giddiness: Secondary | ICD-10-CM | POA: Diagnosis not present

## 2013-09-09 DIAGNOSIS — E7800 Pure hypercholesterolemia, unspecified: Secondary | ICD-10-CM | POA: Insufficient documentation

## 2013-09-09 DIAGNOSIS — R0602 Shortness of breath: Secondary | ICD-10-CM | POA: Diagnosis not present

## 2013-09-09 DIAGNOSIS — Z79899 Other long term (current) drug therapy: Secondary | ICD-10-CM | POA: Insufficient documentation

## 2013-09-09 DIAGNOSIS — I10 Essential (primary) hypertension: Secondary | ICD-10-CM | POA: Insufficient documentation

## 2013-09-09 DIAGNOSIS — Z7982 Long term (current) use of aspirin: Secondary | ICD-10-CM | POA: Diagnosis not present

## 2013-09-09 DIAGNOSIS — IMO0002 Reserved for concepts with insufficient information to code with codable children: Secondary | ICD-10-CM | POA: Diagnosis not present

## 2013-09-09 DIAGNOSIS — H548 Legal blindness, as defined in USA: Secondary | ICD-10-CM | POA: Insufficient documentation

## 2013-09-09 DIAGNOSIS — Z8669 Personal history of other diseases of the nervous system and sense organs: Secondary | ICD-10-CM | POA: Diagnosis not present

## 2013-09-09 DIAGNOSIS — R5383 Other fatigue: Secondary | ICD-10-CM | POA: Diagnosis not present

## 2013-09-09 DIAGNOSIS — R5381 Other malaise: Secondary | ICD-10-CM | POA: Diagnosis not present

## 2013-09-09 MED ORDER — ONDANSETRON HCL 4 MG/2ML IJ SOLN: 4.0000 mg | Freq: Once | INTRAMUSCULAR | Status: DC

## 2013-09-09 MED ORDER — ONDANSETRON 4 MG PO TBDP
4.0000 mg | ORAL_TABLET | Freq: Once | ORAL | Status: AC
  Administered 2013-09-09: 4 mg via ORAL
  Filled 2013-09-09: qty 1

## 2013-09-09 MED ORDER — SODIUM CHLORIDE 0.9 % IV BOLUS (SEPSIS): 1000.0000 mL | Freq: Once | INTRAVENOUS | Status: DC

## 2013-09-09 MED ORDER — ONDANSETRON 4 MG PO TBDP
ORAL_TABLET | ORAL | Status: DC
Start: 2013-09-09 — End: 2013-10-04

## 2013-09-09 NOTE — ED Notes (Addendum)
Pt c/o of dizziness, SOB, and feeling "completely out of it and in a daze." Pt states that these symptoms began about 3 weeks ago. Pt denies history of COPD or CHF. Pt states that she went to PCP yesterday and states that MD said "she is not breathing right through lungs and that has caused damage to the LT side of heart." PCP placed her on 20 mg of Prednisone. Pt also c/o of numbness and tingling in LT hand. Pt states that she has been experiencing nausea and loss of appetite with a weight loss of 5lbs in 2 days, she denies any vomiting.

## 2013-09-09 NOTE — Discharge Instructions (Signed)
Nausea, Adult Nausea is the feeling that you have an upset stomach or have to vomit. Nausea by itself is not likely a serious concern, but it may be an early sign of more serious medical problems. As nausea gets worse, it can lead to vomiting. If vomiting develops, there is the risk of dehydration.  CAUSES   Viral infections.  Food poisoning.  Medicines.  Pregnancy.  Motion sickness.  Migraine headaches.  Emotional distress.  Severe pain from any source.  Alcohol intoxication. HOME CARE INSTRUCTIONS  Get plenty of rest.  Ask your caregiver about specific rehydration instructions.  Eat small amounts of food and sip liquids more often.  Take all medicines as told by your caregiver. SEEK MEDICAL CARE IF:  You have not improved after 2 days, or you get worse.  You have a headache. SEEK IMMEDIATE MEDICAL CARE IF:   You have a fever.  You faint.  You keep vomiting or have blood in your vomit.  You are extremely weak or dehydrated.  You have dark or bloody stools.  You have severe chest or abdominal pain. MAKE SURE YOU:  Understand these instructions.  Will watch your condition.  Will get help right away if you are not doing well or get worse. Document Released: 02/27/2004 Document Revised: 10/14/2011 Document Reviewed: 10/01/2010 [facility] Patient Information 2015 [place], [place]. This information is not intended to replace advice given to you by your health care provider. Make sure you discuss any questions you have with your health care provider.   Dizziness Dizziness is a common problem. It is a feeling of unsteadiness or light-headedness. You may feel like you are about to faint. Dizziness can lead to injury if you stumble or fall. A person of any age group can suffer from dizziness, but dizziness is more common in older adults. CAUSES  Dizziness can be caused by many different things, including:  Middle ear problems.  Standing for too  long.  Infections.  An allergic reaction.  Aging.  An emotional response to something, such as the sight of blood.  Side effects of medicines.  Tiredness.  Problems with circulation or blood pressure.  Excessive use of alcohol or medicines, or illegal drug use.  Breathing too fast (hyperventilation).  An irregular heart rhythm (arrhythmia).  A low red blood cell count (anemia).  Pregnancy.  Vomiting, diarrhea, fever, or other illnesses that cause body fluid loss (dehydration).  Diseases or conditions such as Parkinson's disease, high blood pressure (hypertension), diabetes, and thyroid problems.  Exposure to extreme heat. DIAGNOSIS  Your health care provider will ask about your symptoms, perform a physical exam, and perform an electrocardiogram (ECG) to record the electrical activity of your heart. Your health care provider may also perform other heart or blood tests to determine the cause of your dizziness. These may include:  Transthoracic echocardiogram (TTE). During echocardiography, sound waves are used to evaluate how blood flows through your heart.  Transesophageal echocardiogram (TEE).  Cardiac monitoring. This allows your health care provider to monitor your heart rate and rhythm in real time.  Holter monitor. This is a portable device that records your heartbeat and can help diagnose heart arrhythmias. It allows your health care provider to track your heart activity for several days if needed.  Stress tests by exercise or by giving medicine that makes the heart beat faster. TREATMENT  Treatment of dizziness depends on the cause of your symptoms and can vary greatly. HOME CARE INSTRUCTIONS   Drink enough fluids to keep  your urine clear or pale yellow. This is especially important in very hot weather. In older adults, it is also important in cold weather.  Take your medicine exactly as directed if your dizziness is caused by medicines. When taking blood  pressure medicines, it is especially important to get up slowly.  Rise slowly from chairs and steady yourself until you feel okay.  In the morning, first sit up on the side of the bed. When you feel okay, stand slowly while holding onto something until you know your balance is fine.  Move your legs often if you need to stand in one place for a long time. Tighten and relax your muscles in your legs while standing.  Have someone stay with you for 1-2 days if dizziness continues to be a problem. Do this until you feel you are well enough to stay alone. Have the person call your health care provider if he or she notices changes in you that are concerning.  Do not drive or use heavy machinery if you feel dizzy.  Do not drink alcohol. SEEK IMMEDIATE MEDICAL CARE IF:   Your dizziness or light-headedness gets worse.  You feel nauseous or vomit.  You have problems talking, walking, or using your arms, hands, or legs.  You feel weak.  You are not thinking clearly or you have trouble forming sentences. It may take a friend or family member to notice this.  You have chest pain, abdominal pain, shortness of breath, or sweating.  Your vision changes.  You notice any bleeding.  You have side effects from medicine that seems to be getting worse rather than better. MAKE SURE YOU:   Understand these instructions.  Will watch your condition.  Will get help right away if you are not doing well or get worse. Document Released: 07/15/2000 Document Revised: 01/24/2013 Document Reviewed: 08/08/2010 [facility] Patient Information 2015 [place], [place]. This information is not intended to replace advice given to you by your health care provider. Make sure you discuss any questions you have with your health care provider.

## 2013-09-09 NOTE — ED Provider Notes (Signed)
CSN: [id number]     Arrival date & time 09/09/13  1407 History   First MD Initiated Contact with Patient 09/09/13 1459     Chief Complaint  Patient presents with  . Shortness of Breath  . Nausea  . Dizziness     (Consider location/radiation/quality/duration/timing/severity/associated sxs/prior Treatment) HPI 53 year old female presents with continued lightheadedness and nausea over the last 3 weeks. She's been to [facility], and has not found any diagnoses. She saw her PCP yesterday who stated that an EKG showed she had "heart damage" from lung disease. She was setting up a cardiology followup. Today the patient had recurrent lightheadedness is worse than typical she came in to the ER. She had transient shortness of breath as well. The patient's been having these lightheaded episodes every day for the past 3 weeks. They feel like the room spinning but that she's also got a pass out. Standing up makes it worse. Denies vomiting or diarrhea. No chest pain. No current shortness of breath. She has a shunt for obstructive hydrocephalus but denies any headaches or blurry vision. Denies fevers, cough, abdominal pain, or urinary symptoms. She feels a generalized vague fatigue and weakness. Patient notes that she's had 3 weeks of numbness and tingling to her entire left hand.  Past Medical History  Diagnosis Date  . Unspecified essential hypertension   . Obstructive hydrocephalus   . Pure hypercholesterolemia   . Headache(784.0)   . Chronic rhinitis   . Legal blindness, as defined in [place]    Past Surgical History  Procedure Laterality Date  . Shunt surgery    . Rotator cuff repair    . Ankle fusion     Family History  Problem Relation Age of Onset  . Hypertension Sister    History  Substance Use Topics  . Smoking status: Never Smoker   . Smokeless tobacco: Never Used  . Alcohol Use: No   OB History   Grav Para Term Preterm Abortions TAB SAB Ect Mult Living                  Review of Systems  Constitutional: Positive for fatigue. Negative for fever.  Respiratory: Positive for shortness of breath. Negative for cough.   Cardiovascular: Negative for chest pain.  Gastrointestinal: Positive for nausea. Negative for vomiting, abdominal pain and diarrhea.  Genitourinary: Negative for dysuria.  Neurological: Positive for dizziness, weakness (generalized) and numbness. Negative for headaches.  All other systems reviewed and are negative.     Allergies  Sulfa antibiotics  Home Medications   Prior to Admission medications   Medication Sig Start Date End Date Taking? Authorizing Provider  Ascorbic Acid (VITAMIN C) 1000 MG tablet Take 1,000 mg by mouth daily.   Yes Historical Provider, MD  aspirin 81 MG tablet Take 81 mg by mouth daily.   Yes Historical Provider, MD  atenolol (TENORMIN) 25 MG tablet Take 25 mg by mouth daily.   Yes Historical Provider, MD  buPROPion (WELLBUTRIN XL) 300 MG 24 hr tablet Take 300 mg by mouth daily.   Yes Historical Provider, MD  Calcium Carbonate-Vit D-Min (CALCIUM 1200 PO) Take 1,200 mg by mouth daily.   Yes Historical Provider, MD  cetirizine (ZYRTEC) 10 MG tablet Take 10 mg by mouth daily.   Yes Historical Provider, MD  cholecalciferol (VITAMIN D) 1000 UNITS tablet Take 1,000 Units by mouth daily.   Yes Historical Provider, MD  FLUoxetine (PROZAC) 40 MG capsule Take 40 mg by mouth daily.  Yes Historical Provider, MD  glucosamine-chondroitin 500-400 MG tablet Take 2 tablets by mouth daily.   Yes Historical Provider, MD  hydrochlorothiazide (HYDRODIURIL) 25 MG tablet Take 25 mg by mouth daily.   Yes Historical Provider, MD  mometasone (NASONEX) 50 MCG/ACT nasal spray Place 2 sprays into the nose daily.   Yes Historical Provider, MD  Multiple Vitamin (MULTIVITAMIN WITH MINERALS) TABS tablet Take 1 tablet by mouth daily.   Yes Historical Provider, MD  Olopatadine HCl (PATANASE) 0.6 % SOLN Place 2 drops into the nose 2 (two) times  daily.    Yes Historical Provider, MD  predniSONE (DELTASONE) 20 MG tablet Take 20 mg by mouth daily with breakfast. 60mg  for 3 days, then 40mg  for 3 days, then 20mg  for 3 days and then stop   Yes Historical Provider, MD  prochlorperazine (COMPAZINE) 10 MG tablet Take 1 tablet (10 mg total) by mouth 2 (two) times daily as needed (for headache or nausea). 08/31/13  Yes Tiffany Marilu Favre, PA-C  pseudoephedrine (SUDAFED) 30 MG tablet Take 30 mg by mouth every 4 (four) hours as needed for congestion.   Yes Historical Provider, MD  rosuvastatin (CRESTOR) 10 MG tablet Take 10 mg by mouth daily.   Yes Historical Provider, MD  SUMAtriptan (IMITREX) 100 MG tablet Take 100 mg by mouth every 2 (two) hours as needed for migraine or headache. May repeat in 2 hours if headache persists or recurs.   Yes Historical Provider, MD  triamcinolone acetonide (KENALOG) 40 MG/ML injection Inject 40 mg into the muscle once.   Yes Historical Provider, MD   BP 132/89  Pulse 78  Temp(Src) 97.8 F (36.6 C) (Oral)  Resp 17  SpO2 98%  LMP 04/24/2013 Physical Exam  Nursing note and vitals reviewed. Constitutional: She is oriented to person, place, and time. She appears well-developed and well-nourished. No distress.  Morbidly obese  HENT:  Head: Normocephalic and atraumatic.  Right Ear: External ear normal.  Left Ear: External ear normal.  Nose: Nose normal.  Eyes: EOM are normal. Pupils are equal, round, and reactive to light. Right eye exhibits no discharge. Left eye exhibits no discharge.  Cardiovascular: Normal rate, regular rhythm and normal heart sounds.   Pulmonary/Chest: Effort normal and breath sounds normal. She has no wheezes. She has no rales.  Abdominal: Soft. She exhibits no distension. There is no tenderness.  Musculoskeletal: She exhibits no edema.  Neurological: She is alert and oriented to person, place, and time.  CN 2-12 grossly intact. 5/5 strength in all 4 extremities. Normal gross sensation in all  4 extremities, including left hand  Skin: Skin is warm and dry.    ED Course  Procedures (including critical care time) Labs Review Labs Reviewed - No data to display  Imaging Review Dg Chest 2 View  09/09/2013   CLINICAL DATA:  Shortness of breath, nausea and dizziness. Left arm numbness and tingling.  EXAM: CHEST - 2 VIEW  COMPARISON:  08/31/2013  FINDINGS: Ventriculoperitoneal shunt catheter on the left shows stable appearance. Old catheter tubing on the right also appears stable. There is no evidence of pulmonary edema, consolidation, pneumothorax, nodule or pleural fluid. The heart size is normal. Mild degenerative changes are present throughout the thoracic spine.  IMPRESSION: No active disease.   Electronically Signed   By: Aletta Edouard M.D.   On: 09/09/2013 15:11     EKG Interpretation   Date/Time:  Saturday September 09 2013 14:22:13 EDT Ventricular Rate:  76 PR Interval:  159  QRS Duration: 91 QT Interval:  388 QTC Calculation: 436 R Axis:   136 Text Interpretation:  Sinus rhythm Lateral infarct, old No significant  change since 08/31/13 Confirmed by Torina Ey  MD, Anaijah Augsburger [phone]) on 09/09/2013  2:59:57 PM      MDM   Final diagnoses:  Lightheadedness  Nausea    I had a long conversation with the patient as she is disappointed that we are repeating labs were done at [facility] couple weeks ago. At this point given my exam, EKG, and chest x-ray is no indication that the patient will need admission. Her symptoms been off for over 3 weeks. I have low suspicion for arrhythmia or neurologic cause of her symptoms. She started had 2 CT scans and this time did not show any abnormalities with her shunts or signs of obvious stroke. Given this, patient is now refusing to have blood work drawn. She prefers to just go home with anti-emetics and followup with her PCP. I feel is a reasonable approach given that her symptoms improved prior to arriving to ED. I have low suspicion for ACS,  pulmonary embolism, dissection, or pneumonia. Neurologic exam is normal. I am unable to explain her 3 weeks of left hand numbness and tingling. I've advised her to followup with a neurologist, she states she will get her PCP to refer her. I've asked her to keep her upcoming cardiology appointment and followup with her PCP. Discussed that this workup sinus is not complete and that if any symptoms were change or worsen she come back to the ER.    Ephraim Hamburger, MD 09/09/13 [phone]

## 2013-09-12 ENCOUNTER — Other Ambulatory Visit (HOSPITAL_COMMUNITY): Payer: Self-pay | Admitting: Family Medicine

## 2013-09-12 DIAGNOSIS — R0989 Other specified symptoms and signs involving the circulatory and respiratory systems: Secondary | ICD-10-CM

## 2013-09-12 DIAGNOSIS — R0609 Other forms of dyspnea: Secondary | ICD-10-CM

## 2013-09-13 ENCOUNTER — Ambulatory Visit: Payer: Medicare Other

## 2013-09-13 ENCOUNTER — Ambulatory Visit (HOSPITAL_COMMUNITY): Payer: Medicare Other | Attending: Family Medicine

## 2013-09-13 DIAGNOSIS — I059 Rheumatic mitral valve disease, unspecified: Secondary | ICD-10-CM | POA: Diagnosis not present

## 2013-09-13 DIAGNOSIS — R0602 Shortness of breath: Secondary | ICD-10-CM | POA: Insufficient documentation

## 2013-09-13 DIAGNOSIS — R51 Headache: Secondary | ICD-10-CM | POA: Insufficient documentation

## 2013-09-13 DIAGNOSIS — I10 Essential (primary) hypertension: Secondary | ICD-10-CM | POA: Insufficient documentation

## 2013-09-13 DIAGNOSIS — R0609 Other forms of dyspnea: Secondary | ICD-10-CM

## 2013-09-13 DIAGNOSIS — R0989 Other specified symptoms and signs involving the circulatory and respiratory systems: Secondary | ICD-10-CM

## 2013-09-13 DIAGNOSIS — E785 Hyperlipidemia, unspecified: Secondary | ICD-10-CM | POA: Diagnosis not present

## 2013-09-13 NOTE — Progress Notes (Signed)
2D Echo completed. 09/13/2013 

## 2013-09-15 ENCOUNTER — Ambulatory Visit (INDEPENDENT_AMBULATORY_CARE_PROVIDER_SITE_OTHER): Payer: Medicare Other | Admitting: Cardiovascular Disease

## 2013-09-15 VITALS — BP 163/80 | HR 72 | Ht 63.0 in | Wt 311.0 lb

## 2013-09-15 DIAGNOSIS — R42 Dizziness and giddiness: Secondary | ICD-10-CM

## 2013-09-15 DIAGNOSIS — R0789 Other chest pain: Secondary | ICD-10-CM

## 2013-09-15 DIAGNOSIS — R9431 Abnormal electrocardiogram [ECG] [EKG]: Secondary | ICD-10-CM

## 2013-09-15 DIAGNOSIS — R0600 Dyspnea, unspecified: Secondary | ICD-10-CM | POA: Insufficient documentation

## 2013-09-15 LAB — CORTISOL: Cortisol, Plasma: 10.8 ug/dL

## 2013-09-15 NOTE — Progress Notes (Signed)
Patient ID: Stacy Hurst, female   DOB: Jun 27, 1960, 53 y.o.   MRN: [id number]    53 yo referred by ER, and Dr Stacy Hurst.  She has had dizzyness, dyspnea and abnormal ECG  History of hydrocephalus with shunt and revision in 94.  Has seen neuro for headaches in June ? Migraines started on topamax.  Seen in ER 8/8 and d/c with no arrhythmia, normal CXR.  No frank syncope  Has also had nausea.  No appetite  Lots of headaches Had to stop topamax for left arm tingling.  On atenolol for migraine prophylaxis.  No chest pain.  Dizzyness not postural can last hours No true vertigo  Shunt apparently working normally    Echo done 09/13/13  Normal EF 55-60% no valve disease, no pulmonary hypertension diastolic relaxation abnormality grade 1    ROS: Denies fever, malais, weight loss, blurry vision, decreased visual acuity, cough, sputum, SOB, hemoptysis, pleuritic pain, palpitaitons, heartburn, abdominal pain, melena, lower extremity edema, claudication, or rash.  All other systems reviewed and negative   General: Affect appropriate Obese white female  HEENT: normal Neck supple with no adenopathy JVP normal no bruits no thyromegaly Lungs clear with no wheezing and good diaphragmatic motion Heart:  S1/S2 no murmur,rub, gallop or click PMI normal Abdomen: benighn, BS positve, no tenderness, no AAA no bruit.  No HSM or HJR Distal pulses intact with no bruits No edema Neuro non-focal Skin warm and dry No muscular weakness  Medications Current Outpatient Prescriptions  Medication Sig Dispense Refill  . Ascorbic Acid (VITAMIN C) 1000 MG tablet Take 1,000 mg by mouth daily.      Marland Kitchen aspirin 81 MG tablet Take 81 mg by mouth daily.      Marland Kitchen atenolol (TENORMIN) 25 MG tablet Take 25 mg by mouth daily.      Marland Kitchen buPROPion (WELLBUTRIN XL) 300 MG 24 hr tablet Take 300 mg by mouth daily.      . Calcium Carbonate-Vit D-Min (CALCIUM 1200 PO) Take 1,200 mg by mouth daily.      . cetirizine (ZYRTEC) 10 MG tablet Take  10 mg by mouth daily.      . cholecalciferol (VITAMIN D) 1000 UNITS tablet Take 1,000 Units by mouth daily.      Marland Kitchen FLUoxetine (PROZAC) 40 MG capsule Take 40 mg by mouth daily.      Marland Kitchen glucosamine-chondroitin 500-400 MG tablet Take 2 tablets by mouth daily.      . hydrochlorothiazide (HYDRODIURIL) 25 MG tablet Take 25 mg by mouth daily.      . mometasone (NASONEX) 50 MCG/ACT nasal spray Place 2 sprays into the nose daily.      . Multiple Vitamin (MULTIVITAMIN WITH MINERALS) TABS tablet Take 1 tablet by mouth daily.      . Olopatadine HCl (PATANASE) 0.6 % SOLN Place 2 drops into the nose 2 (two) times daily.       . ondansetron (ZOFRAN ODT) 4 MG disintegrating tablet 4mg  ODT q4 hours prn nausea/vomit  20 tablet  0  . predniSONE (DELTASONE) 20 MG tablet Take 20 mg by mouth daily with breakfast. 60mg  for 3 days, then 40mg  for 3 days, then 20mg  for 3 days and then stop      . prochlorperazine (COMPAZINE) 10 MG tablet Take 1 tablet (10 mg total) by mouth 2 (two) times daily as needed (for headache or nausea).  10 tablet  0  . pseudoephedrine (SUDAFED) 30 MG tablet Take 30 mg by mouth every 4 (four)  hours as needed for congestion.      . rosuvastatin (CRESTOR) 10 MG tablet Take 10 mg by mouth daily.      . SUMAtriptan (IMITREX) 100 MG tablet Take 100 mg by mouth every 2 (two) hours as needed for migraine or headache. May repeat in 2 hours if headache persists or recurs.      . triamcinolone acetonide (KENALOG) 40 MG/ML injection Inject 40 mg into the muscle once.       No current facility-administered medications for this visit.    Allergies Sulfa antibiotics  Family History: Family History  Problem Relation Age of Onset  . Hypertension Sister     Social History: History   Social History  . Marital Status: Single    Spouse Name: N/A    Number of Children: N/A  . Years of Education: N/A   Occupational History  . Not on file.   Social History Main Topics  . Smoking status: Never Smoker    . Smokeless tobacco: Never Used  . Alcohol Use: No  . Drug Use: No  . Sexual Activity: Not on file   Other Topics Concern  . Not on file   Social History Narrative   Single, no children   Left handed   10 th grade   48 oz diet coke daily 16 oz tea occ    Electrocardiogram: SR low voltage poor R wave progression limb lead reversal   Assessment and Plan

## 2013-09-15 NOTE — Assessment & Plan Note (Signed)
Related to obesity.  Normal echo and CXR  No need for further cardiac w/u Doubt PE defer this w/u to primary

## 2013-09-15 NOTE — Assessment & Plan Note (Addendum)
Not cardiac related no need for monitor  F/u primary and neuro  Will check random cortisol given previous CNS shunt nausea and dizzyness

## 2013-09-15 NOTE — Assessment & Plan Note (Signed)
Related to body habitus Echo with no evidence of RWMA, pulmonary hypertension or other cardiac pathology

## 2013-09-15 NOTE — Patient Instructions (Signed)
Your physician recommends that you schedule a follow-up appointment in: AS NEEDED  Your physician recommends that you continue on your current medications as directed. Please refer to the Current Medication list given to you today. Your physician recommends that you return for lab work in: [place]

## 2013-09-18 ENCOUNTER — Telehealth: Payer: Self-pay | Admitting: Cardiovascular Disease

## 2013-09-18 ENCOUNTER — Ambulatory Visit
Admission: RE | Admit: 2013-09-18 | Discharge: 2013-09-18 | Disposition: A | Discharge status: Home / Self Care | Payer: Medicare Other | Source: Ambulatory Visit

## 2013-09-18 DIAGNOSIS — Z1231 Encounter for screening mammogram for malignant neoplasm of breast: Secondary | ICD-10-CM

## 2013-09-18 NOTE — Telephone Encounter (Signed)
New message  ° ° ° °Patient returning call back to nurse from Friday.   °

## 2013-09-18 NOTE — Telephone Encounter (Signed)
Spoke about recent lab results.

## 2013-09-19 ENCOUNTER — Ambulatory Visit (HOSPITAL_COMMUNITY)
Admission: RE | Admit: 2013-09-19 | Discharge: 2013-09-19 | Disposition: A | Discharge status: Home / Self Care | Payer: Medicare Other | Source: Ambulatory Visit | Attending: Cardiovascular Disease | Admitting: Cardiovascular Disease

## 2013-09-19 DIAGNOSIS — R0789 Other chest pain: Secondary | ICD-10-CM | POA: Diagnosis not present

## 2013-09-20 ENCOUNTER — Encounter: Payer: Self-pay | Admitting: Cardiology

## 2013-09-25 ENCOUNTER — Telehealth: Payer: Self-pay | Admitting: Cardiovascular Disease

## 2013-09-25 NOTE — Telephone Encounter (Signed)
PT AWARE OF  ABD  US./..CY

## 2013-09-25 NOTE — Telephone Encounter (Signed)
New problem ° ° °Pt want to know results of her US. Please call pt. °

## 2013-10-04 ENCOUNTER — Encounter: Payer: Self-pay | Admitting: Neurology

## 2013-10-04 ENCOUNTER — Ambulatory Visit (INDEPENDENT_AMBULATORY_CARE_PROVIDER_SITE_OTHER): Payer: Medicare Other | Admitting: Neurology

## 2013-10-04 ENCOUNTER — Ambulatory Visit: Payer: Medicare Other | Admitting: Neurology

## 2013-10-04 VITALS — BP 122/82 | HR 68 | Temp 98.3°F | Ht 64.25 in | Wt 317.0 lb

## 2013-10-04 DIAGNOSIS — G919 Hydrocephalus, unspecified: Secondary | ICD-10-CM | POA: Insufficient documentation

## 2013-10-04 DIAGNOSIS — G43019 Migraine without aura, intractable, without status migrainosus: Secondary | ICD-10-CM

## 2013-10-04 DIAGNOSIS — G5600 Carpal tunnel syndrome, unspecified upper limb: Secondary | ICD-10-CM

## 2013-10-04 DIAGNOSIS — G5603 Carpal tunnel syndrome, bilateral upper limbs: Secondary | ICD-10-CM

## 2013-10-04 DIAGNOSIS — G911 Obstructive hydrocephalus: Secondary | ICD-10-CM

## 2013-10-04 MED ORDER — ATENOLOL 25 MG PO TABS: ORAL_TABLET | ORAL | Status: DC

## 2013-10-04 NOTE — Progress Notes (Signed)
NEUROLOGY CONSULTATION NOTE  MERSEDES ALBER MRN: [id number] DOB: 1960-05-29  Referring provider: Dr. Jonathon Jordan Primary care provider: Dr. Jonathon Jordan  Reason for consult:  headaches  Dear Dr Stephanie Acre:  Thank you for your kind referral of Stacy Hurst for consultation of the above symptoms. Although her history is well known to you, please allow me to reiterate it for the purpose of our medical record. The patient was accompanied to the clinic by her mother who also provides collateral information. Records and images were personally reviewed where available.  HISTORY OF PRESENT ILLNESS: This is a 53 year old left-handed woman with a history of congenital hydrocephalus s/p shunt at age 16 with revision in 66. She reports having headaches for as far back as she can remember.  She describes two types of headaches, migraines and headaches that seem to be related to sinus issues. The migraines are described as severe pounding pain over her left side, up to 7/10 in intensity, occurring around once a week. The other type of headache is a pressure sensation over the left eye, lasting for a few hours to a couple of days. She feels that when she takes sinus medication, these headaches ease off. She states that she has one or the other headache everyday.  She takes Advil 2-3 times a week, and Imitrex once a week on average.  Headaches are associated with photophobia. She is constantly nauseated even in between headaches.  When headaches are severe, she would have some dizziness.  She has been seeing ophthalmology at [facility] for many years for a history of papilledema with bilateral optic atrophy and visual field defects.  She reports vision is unchanged.  She had seen neurologist Dr. Janann Colonel 3 months ago for the headaches and started Topamax. She reports that she stopped the medication 6 weeks ago because she felt hot and dizzy, then started having numbness and tingling in her left hand and arm.   The paresthesias on the arm have resolved, however she still has constant numbness on her left hand, as well as the fingers on her right hand. She denies any wrist pain.  She has been taking Atenolol 25mg  daily for many years for headache prophylaxis.    Per records, she has been extensively evaluated by ENT for possible sinus headaches. They do not feel she is suffering from sinus headaches. No history of glaucoma or kidney stones. She had seen neurosurgeon Dr. Christella Noa yesterday and has been told her shunt is functioning fine.  Her mother and sister have headaches.  She denies any diplopia, dysarthria, dysphagia, neck/back pain, bowel/bladder dysfunction. She has occasional left jaw pain, occasional tinnitus, no ear pain/fullness.    I personally reviewed head CT without contrast done 08/2013, stable from 2010. Report as follows: The patient has had a previous left frontoparietal craniotomy. There is a shunt catheter extending from the left frontal region with its tip in the right lateral ventricle, stable. A second shunt catheter placed from a right occipital approach has its tip medial to the atrium of the right lateral ventricle, stable. The ventricles are  decompressed. There is no appreciable hemorrhage, extra-axial fluid collection, or midline shift. There is an apparent arachnoid cyst slightly superior and anterior to the third ventricle measuring 2.3 x 2.2 cm, stable. There is no other mass. There is evidence of prior infarct anterior to the lateral ventricles. There is no demonstrable  corpus callosum on this study.  Laboratory Data: Lab Results  Component Value Date  WBC 7.4 08/31/2013   HGB 13.1 08/31/2013   HCT 38.9 08/31/2013   MCV 95.8 08/31/2013   PLT 254 08/31/2013     Chemistry      Component Value Date/Time   NA 142 08/31/2013 1625   K 3.7 08/31/2013 1625   CL 103 08/31/2013 1625   CO2 24 08/31/2013 1625   BUN 7 08/31/2013 1625   CREATININE 0.89 08/31/2013 1625      Component  Value Date/Time   CALCIUM 9.5 08/31/2013 1625   ALKPHOS 82 09/05/2007 1527   AST 17 09/05/2007 1527   ALT 14 09/05/2007 1527   BILITOT 0.6 09/05/2007 1527       PAST MEDICAL HISTORY: Past Medical History  Diagnosis Date  . Unspecified essential hypertension   . Obstructive hydrocephalus   . Pure hypercholesterolemia   . Headache(784.0)   . Chronic rhinitis   . Legal blindness, as defined in [place]     PAST SURGICAL HISTORY: Past Surgical History  Procedure Laterality Date  . Shunt surgery    . Rotator cuff repair    . Ankle fusion      MEDICATIONS: Current Outpatient Prescriptions on File Prior to Visit  Medication Sig Dispense Refill  . Ascorbic Acid (VITAMIN C) 1000 MG tablet Take 1,000 mg by mouth daily.      Marland Kitchen aspirin 81 MG tablet Take 1 tablet by mouth.      Marland Kitchen buPROPion (WELLBUTRIN XL) 300 MG 24 hr tablet Take 300 mg by mouth daily.      . Calcium Carbonate-Vit D-Min (CALCIUM 1200 PO) Take 1,200 mg by mouth daily.      . cetirizine (ZYRTEC) 10 MG tablet Take 10 mg by mouth daily.      . cholecalciferol (VITAMIN D) 1000 UNITS tablet Take 1,000 Units by mouth daily.      Marland Kitchen FLUoxetine (PROZAC) 40 MG capsule Take 40 mg by mouth daily.      Marland Kitchen glucosamine-chondroitin 500-400 MG tablet Take 2 tablets by mouth daily.      . hydrochlorothiazide (HYDRODIURIL) 25 MG tablet Take 25 mg by mouth daily.      Marland Kitchen KLOR-CON M20 20 MEQ tablet       . mometasone (NASONEX) 50 MCG/ACT nasal spray Place 2 sprays into the nose daily.      . Multiple Vitamin (MULTIVITAMIN WITH MINERALS) TABS tablet Take 1 tablet by mouth daily.      . Olopatadine HCl (PATANASE) 0.6 % SOLN Place 2 drops into the nose 2 (two) times daily.       . prochlorperazine (COMPAZINE) 10 MG tablet Take 1 tablet (10 mg total) by mouth 2 (two) times daily as needed (for headache or nausea).  10 tablet  0  . rosuvastatin (CRESTOR) 10 MG tablet Take 10 mg by mouth daily.      . SUMAtriptan (IMITREX) 100 MG tablet Take 100 mg by mouth  every 2 (two) hours as needed for migraine or headache. May repeat in 2 hours if headache persists or recurs.       No current facility-administered medications on file prior to visit.    ALLERGIES: Allergies  Allergen Reactions  . Amoxicillin   . Ibuprofen Hives    GI upset in high doses.   . Sulfa Antibiotics Hives  . Sulfamethoxazole Hives    unknown    FAMILY HISTORY: Family History  Problem Relation Age of Onset  . Hypertension Sister     SOCIAL HISTORY: History   Social History  .  Marital Status: Single    Spouse Name: N/A    Number of Children: N/A  . Years of Education: N/A   Occupational History  . Not on file.   Social History Main Topics  . Smoking status: Never Smoker   . Smokeless tobacco: Never Used  . Alcohol Use: No  . Drug Use: No  . Sexual Activity: Not on file   Other Topics Concern  . Not on file   Social History Narrative   Single, no children   Left handed   10 th grade   48 oz diet coke daily 16 oz tea occ    REVIEW OF SYSTEMS: Constitutional: No fevers, chills, or sweats, no generalized fatigue, change in appetite Eyes: as above Ear, nose and throat: No hearing loss, ear pain, nasal congestion, sore throat Cardiovascular: No chest pain, palpitations Respiratory:  No shortness of breath at rest or with exertion, wheezes GastrointestinaI: No nausea, vomiting, diarrhea, abdominal pain, fecal incontinence Genitourinary:  No dysuria, urinary retention or frequency Musculoskeletal:  No neck pain, back pain Integumentary: No rash, pruritus, skin lesions Neurological: as above Psychiatric: No depression, insomnia, anxiety Endocrine: No palpitations, fatigue, diaphoresis, mood swings, change in appetite, change in weight, increased thirst Hematologic/Lymphatic:  No anemia, purpura, petechiae. Allergic/Immunologic: no itchy/runny eyes, nasal congestion, recent allergic reactions, rashes  PHYSICAL EXAM: Filed Vitals:   10/04/13 0949    BP: 122/82  Pulse: 68  Temp: 98.3 F (36.8 C)   General: No acute distress Head:  Normocephalic/atraumatic Eyes: Fundoscopic exam shows flat discs Neck: supple, no paraspinal tenderness, full range of motion Back: No paraspinal tenderness Heart: regular rate and rhythm Lungs: Clear to auscultation bilaterally. Vascular: No carotid bruits. Skin/Extremities: No rash, no edema Neurological Exam: Mental status: alert and oriented to person, place, and time, no dysarthria or aphasia, Fund of knowledge is appropriate.  Recent and remote memory are intact.  Attention and concentration are normal.    Able to name objects and repeat phrases. Cranial nerves: CN I: not tested CN II: pupils equal, round and reactive to light, visual fields intact on right, restricted peripheral fields on left eye. VA with correction 20/400 OU. Fundi show flat discs bilaterally CN III, IV, VI:  full range of motion, no nystagmus, no ptosis CN V: facial sensation intact CN VII: upper and lower face symmetric CN VIII: hearing intact to finger rub CN IX, X: gag intact, uvula midline CN XI: sternocleidomastoid and trapezius muscles intact CN XII: tongue midline Bulk & Tone: normal, no fasciculations. Motor: 5/5 throughout with no pronator drift. Sensation: she reports tingling and decreased pin and cold on the right foot (chronic since ankle surgery), otherwise intact to vibration, joint position sense, intact to all modalities on both UE, including hands.Romberg test negative Deep Tendon Reflexes: +1 both UE, +2 right patella, +1 left patella, absent ankle jerks bilaterally. no ankle clonus Plantar responses: downgoing bilaterally Cerebellar: no incoordination on finger to nose, heel to shin. No dysdiadochokinesia Gait: wide-based, no ataxia, able to do tandem walk Tremor: none Negative Tinel's sign at the elbow and wrist bilaterally, negative Phalen's sign  IMPRESSION: This is a 52 year old left-handed woman  with a history of congenital hydrocephalus s/p shunt presenting with chronic daily headaches since childhood. Some of headaches have migrainous features. She did not tolerate Topamax and will instead increase dose of Atenolol for headache prophylaxis.  Side effects were discussed, she will monitor BP. She has seen neurosurgery and been told shunt is functioning well.  She has constant nausea even when headaches are not present, she will speak with her PCP regarding this.  We discussed medication overuse headaches, she knows to minimize any rescue medication to 2-3 a week and will keep a headache diary. We discussed the numbness in both hands, L>R, suggestive of carpal tunnel syndrome. She will start wearing wrist splints and avoid compression at the wrist. If symptoms persist or progress, EMG/NCV will be ordered. She will follow-up in 4 months.  Thank you for allowing me to participate in the care of this patient. Please do not hesitate to call for any questions or concerns.   Ellouise Newer, M.D.  CC: Dr. Stephanie Acre

## 2013-10-04 NOTE — Patient Instructions (Addendum)
1. Increase Atenolol 25mg : take 2 tablets at bedtime 2. Start wearing wrist splints on both wrists 3. Minimize intake of Imitrex and Advil to 2-3 a week 4. Keep headache diary 5. Follow-up in 4 months

## 2013-10-11 ENCOUNTER — Ambulatory Visit: Payer: Medicare Other | Admitting: Cardiology

## 2013-11-09 ENCOUNTER — Encounter: Payer: Self-pay | Admitting: *Deleted

## 2013-11-13 ENCOUNTER — Ambulatory Visit: Payer: Medicare Other | Admitting: Cardiology

## 2013-12-22 ENCOUNTER — Encounter: Payer: Self-pay | Admitting: Cardiology

## 2014-02-05 ENCOUNTER — Ambulatory Visit (INDEPENDENT_AMBULATORY_CARE_PROVIDER_SITE_OTHER): Payer: Medicare Other | Admitting: Neurology

## 2014-02-05 ENCOUNTER — Encounter: Payer: Self-pay | Admitting: Neurology

## 2014-02-05 VITALS — BP 132/70 | HR 70 | Temp 98.2°F | Resp 18 | Ht 64.5 in | Wt 320.2 lb

## 2014-02-05 DIAGNOSIS — G43019 Migraine without aura, intractable, without status migrainosus: Secondary | ICD-10-CM

## 2014-02-05 DIAGNOSIS — G919 Hydrocephalus, unspecified: Secondary | ICD-10-CM

## 2014-02-05 MED ORDER — ATENOLOL 25 MG PO TABS: ORAL_TABLET | ORAL | Status: AC

## 2014-02-05 NOTE — Progress Notes (Signed)
NEUROLOGY FOLLOW UP OFFICE NOTE  Stacy Hurst [id number]  HISTORY OF PRESENT ILLNESS: I had the pleasure of seeing Stacy Hurst in follow-up in the neurology clinic on 02/05/2014.  The patient was last seen 4 months ago for chronic daily headaches. On her last visit, dose of Atenolol was increased to 50mg  qhs. She feels that this is helping with her headaches, they still occur on a daily basis but are less severe. She has chronic sinusitis and feels that everyday she has some type of a headache, either sinus headaches or a mild migraine, mostly over the left eye 95% of the time. No associated photo/phonophobia. She thinks nausea is due to sinus congestion/drainage. She denies any focal numbness/tingling/weakness except for mild tingling in the left middle finger. She is concerned that the pain on the left eye is where her shunt is. She had been seeing neurosurgeon Dr. Carloyn Manner once a year to follow-up on the shunt.   HPI: This is a 54 yo LH woman with a history of congenital hydrocephalus s/p shunt at age 54 with revision in 54. She reports having headaches for as far back as she can remember. She describes two types of headaches, migraines and headaches that seem to be related to sinus issues. The migraines are described as severe pounding pain over her left side, up to 7/10 in intensity, occurring around once a week. The other type of headache is a pressure sensation over the left eye, lasting for a few hours to a couple of days. She feels that when she takes sinus medication, these headaches ease off. She states that she has one or the other headache everyday. She takes Advil 2-3 times a week, and Imitrex once a week on average. Headaches are associated with photophobia. She is constantly nauseated even in between headaches. When headaches are severe, she would have some dizziness. She has been seeing ophthalmology at [facility] for many years for a history of papilledema with bilateral optic  atrophy and visual field defects. She reports vision is unchanged. She did not tolerate Topamax.  Per records, she has been extensively evaluated by ENT for possible sinus headaches. They do not feel she is suffering from sinus headaches. No history of glaucoma or kidney stones. She had seen neurosurgeon Dr. Christella Noa and has been told her shunt is functioning fine. Her mother and sister have headaches.   I personally reviewed head CT without contrast done 08/2013, stable from 2010. Report as follows: The patient has had a previous left frontoparietal craniotomy. There is a shunt catheter extending from the left frontal region with its tip in the right lateral ventricle, stable. A second shunt catheter placed from a right occipital approach has its tip medial to the atrium of the right lateral ventricle, stable. The ventricles are decompressed. There is no appreciable hemorrhage, extra-axial fluid collection, or midline shift. There is an apparent arachnoid cyst slightly superior and anterior to the third ventricle measuring 2.3 x 2.2 cm, stable. There is no other mass. There is evidence of prior infarct anterior to the lateral ventricles. There is no demonstrable corpus callosum on this study.  PAST MEDICAL HISTORY: Past Medical History  Diagnosis Date  . Unspecified essential hypertension   . Obstructive hydrocephalus   . Pure hypercholesterolemia   . Headache(784.0)   . Chronic rhinitis   . Legal blindness, as defined in [place]     MEDICATIONS: Current Outpatient Prescriptions on File Prior to Visit  Medication Sig Dispense Refill  . Ascorbic  Acid (VITAMIN C) 1000 MG tablet Take 1,000 mg by mouth daily.    Marland Kitchen aspirin 81 MG tablet Take 1 tablet by mouth.    Marland Kitchen buPROPion (WELLBUTRIN XL) 300 MG 24 hr tablet Take 300 mg by mouth daily.    . Calcium Carbonate-Vit D-Min (CALCIUM 1200 PO) Take 1,200 mg by mouth daily.    . cetirizine (ZYRTEC) 10 MG tablet Take 10 mg by mouth daily.    .  cholecalciferol (VITAMIN D) 1000 UNITS tablet Take 1,000 Units by mouth daily.    Marland Kitchen FLUoxetine (PROZAC) 40 MG capsule Take 40 mg by mouth daily.    Marland Kitchen glucosamine-chondroitin 500-400 MG tablet Take 2 tablets by mouth daily.    . hydrochlorothiazide (HYDRODIURIL) 25 MG tablet Take 25 mg by mouth daily.    Marland Kitchen KLOR-CON M20 20 MEQ tablet     . mometasone (NASONEX) 50 MCG/ACT nasal spray Place 2 sprays into the nose daily.    . Multiple Vitamin (MULTIVITAMIN WITH MINERALS) TABS tablet Take 1 tablet by mouth daily.    . Olopatadine HCl (PATANASE) 0.6 % SOLN Place 2 drops into the nose 2 (two) times daily.     . prochlorperazine (COMPAZINE) 10 MG tablet Take 1 tablet (10 mg total) by mouth 2 (two) times daily as needed (for headache or nausea). 10 tablet 0  . rosuvastatin (CRESTOR) 10 MG tablet Take 10 mg by mouth daily.    . SUMAtriptan (IMITREX) 100 MG tablet Take 100 mg by mouth every 2 (two) hours as needed for migraine or headache. May repeat in 2 hours if headache persists or recurs.     No current facility-administered medications on file prior to visit.    ALLERGIES: Allergies  Allergen Reactions  . Amoxicillin   . Ibuprofen Hives    GI upset in high doses.   . Sulfa Antibiotics Hives  . Sulfamethoxazole Hives    unknown    FAMILY HISTORY: Family History  Problem Relation Age of Onset  . Hypertension Sister   . Hypertension Mother   . Heart failure Father     SOCIAL HISTORY: History   Social History  . Marital Status: Single    Spouse Name: N/A    Number of Children: N/A  . Years of Education: N/A   Occupational History  . Not on file.   Social History Main Topics  . Smoking status: Never Smoker   . Smokeless tobacco: Never Used  . Alcohol Use: No  . Drug Use: No  . Sexual Activity: Not on file   Other Topics Concern  . Not on file   Social History Narrative   Single, no children   Left handed   10 th grade   48 oz diet coke daily 16 oz tea occ    REVIEW  OF SYSTEMS: Constitutional: No fevers, chills, or sweats, no generalized fatigue, change in appetite Eyes: No visual changes, double vision, eye pain Ear, nose and throat: No hearing loss, ear pain, nasal congestion, sore throat Cardiovascular: No chest pain, palpitations Respiratory:  No shortness of breath at rest or with exertion, wheezes GastrointestinaI: No nausea, vomiting, diarrhea, abdominal pain, fecal incontinence Genitourinary:  No dysuria, urinary retention or frequency Musculoskeletal:  No neck pain, back pain Integumentary: No rash, pruritus, skin lesions Neurological: as above Psychiatric: No depression, insomnia, anxiety Endocrine: No palpitations, fatigue, diaphoresis, mood swings, change in appetite, change in weight, increased thirst Hematologic/Lymphatic:  No anemia, purpura, petechiae. Allergic/Immunologic: no itchy/runny eyes, nasal congestion, recent allergic  reactions, rashes  PHYSICAL EXAM: Filed Vitals:   02/05/14 1323  BP: 132/70  Pulse: 70  Temp: 98.2 F (36.8 C)  Resp: 18   General: No acute distress Head:  Normocephalic/atraumatic Neck: supple, no paraspinal tenderness, full range of motion Heart:  Regular rate and rhythm Lungs:  Clear to auscultation bilaterally Back: No paraspinal tenderness Skin/Extremities: No rash, no edema Neurological Exam: alert and oriented to person, place, and time, no dysarthria or aphasia, Fund of knowledge is appropriate. Recent and remote memory are intact. Attention and concentration are normal. Able to name objects and repeat phrases. Cranial nerves: CN I: not tested CN II: pupils equal, round and reactive to light, visual fields intact on right, restricted peripheral fields on left eye, similar to prior, sees mostly on the right inferior quadrant. Fundoscopy shows flat discs bilaterally CN III, IV, VI: full range of motion, no nystagmus, no ptosis CN V: facial sensation intact CN VII: upper and lower face  symmetric CN VIII: hearing intact to finger rub CN IX, X: gag intact, uvula midline CN XI: sternocleidomastoid and trapezius muscles intact CN XII: tongue midline Bulk & Tone: normal, no fasciculations. Motor: 5/5 throughout with no pronator drift. Sensation: Intact to light touch. No extinction to double simultaneous stimulation. Romberg test negative Deep Tendon Reflexes: +1 both UE, +2 right patella, +1 left patella, absent ankle jerks bilaterally. Plantar responses: downgoing bilaterally Cerebellar: no incoordination on finger to nose testing Gait: wide-based, slow and cautious, no ataxia, able to do tandem walk with mild difficulty Tremor: none  IMPRESSION: This is a 54 yo LH woman with a history of congenital hydrocephalus s/p shunt with chronic daily headaches since childhood. Some of headaches have migrainous features. She feels the higher dose of Atenolol is helping with her headaches, they continue to be daily but have decreased in intensity. She continues to express concern about shunt malfunction, CT results discussed with patient that there has been no significant change since 2010. She will continue with neurosurgery follow-up for the shunt. We again discussed medication overuse headaches, she knows to minimize any rescue medication to 2-3 a week and will keep a headache diary. She will follow-up in 4 months.  Thank you for allowing me to participate in her care.  Please do not hesitate to call for any questions or concerns.  The duration of this appointment visit was 15 minutes of face-to-face time with the patient.  Greater than 50% of this time was spent in counseling, explanation of diagnosis, planning of further management, and coordination of care.   Ellouise Newer, M.D.   CC: Dr. Stephanie Acre

## 2014-02-05 NOTE — Patient Instructions (Signed)
1. Continue Atenolol 25mg  2 tablets daily 2. Follow-up with neurosurgery for shunt 3. Keep a headache calendar

## 2014-02-06 ENCOUNTER — Encounter: Payer: Self-pay | Admitting: Neurology

## 2014-06-06 ENCOUNTER — Ambulatory Visit: Payer: Medicare Other | Admitting: Neurology

## 2014-08-09 IMAGING — CT CT HEAD W/O CM
1 series · 15 of 30 positions shown, 19 images · non-contrast
Comparison: February 10, 2008

CLINICAL DATA: Headaches; previous shunt catheter placement

EXAM:
CT HEAD WITHOUT CONTRAST
TECHNIQUE: Contiguous axial images were obtained from the base of the skull
through the vertex without intravenous contrast.

[Series 2: head 5.0 h30s · axial · 0.48mm/px · z∈[-133,+22]mm · 15 of 35 slices shown, 19 images]
[im 2/35  brain]
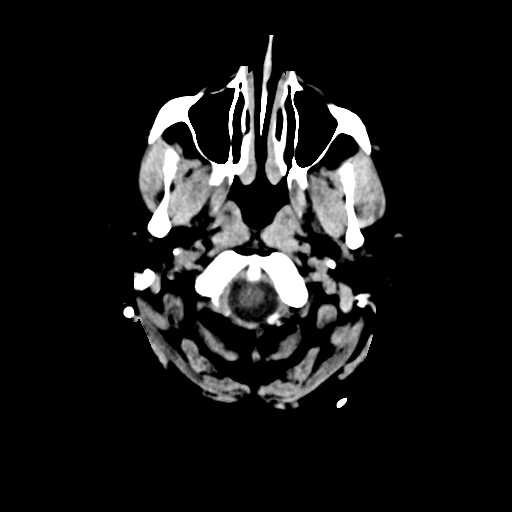
[im 2/35  bone]
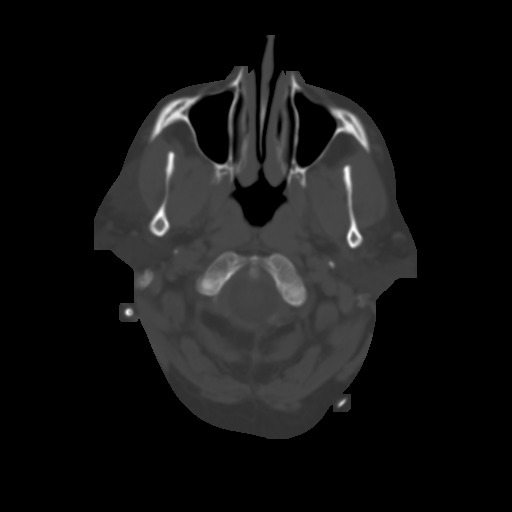
[im 4/35  brain]
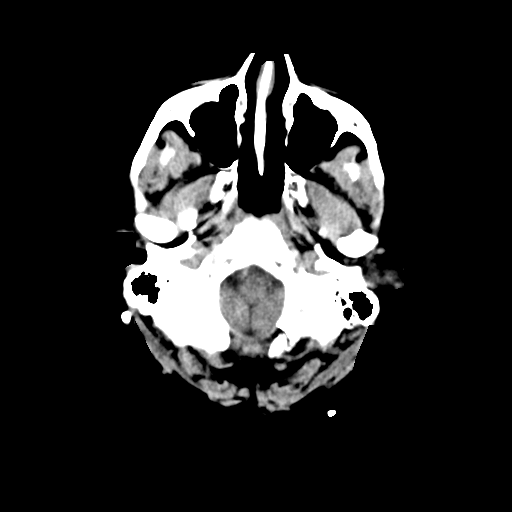
[im 6/35  brain]
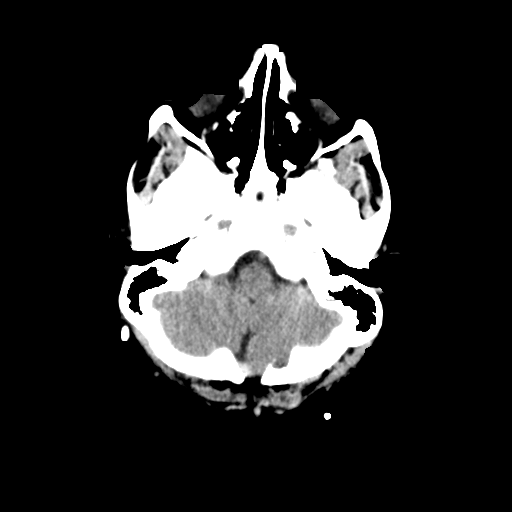
[im 9/35  brain]
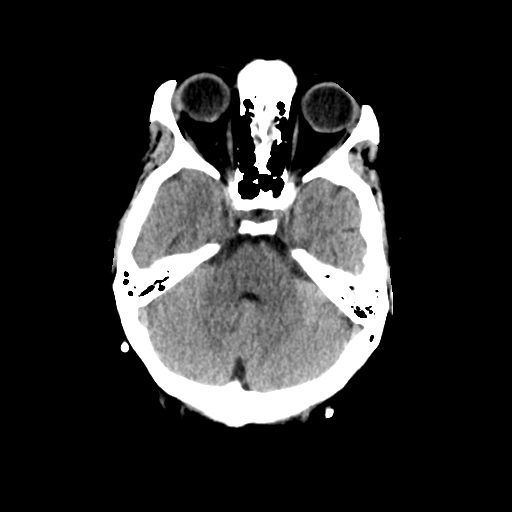
[im 11/35  brain]
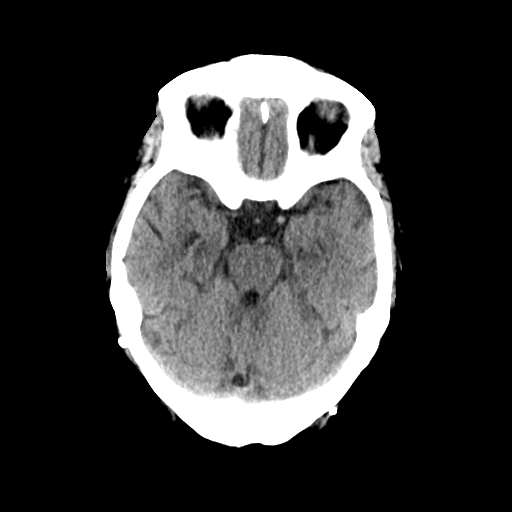
[im 11/35  bone]
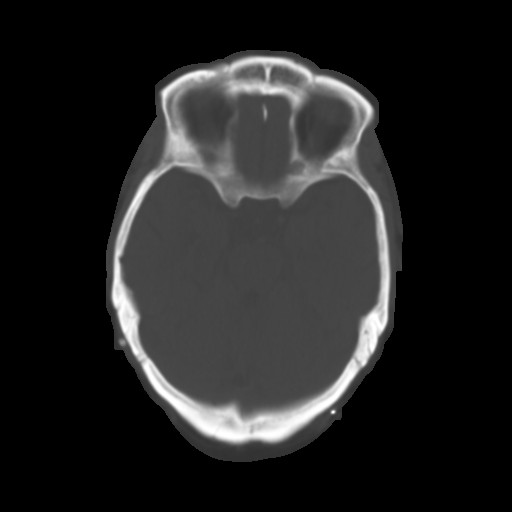
[im 13/35  brain]
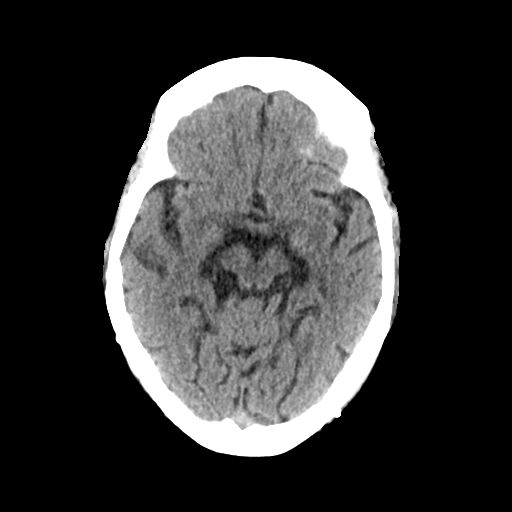
[im 16/35  brain]
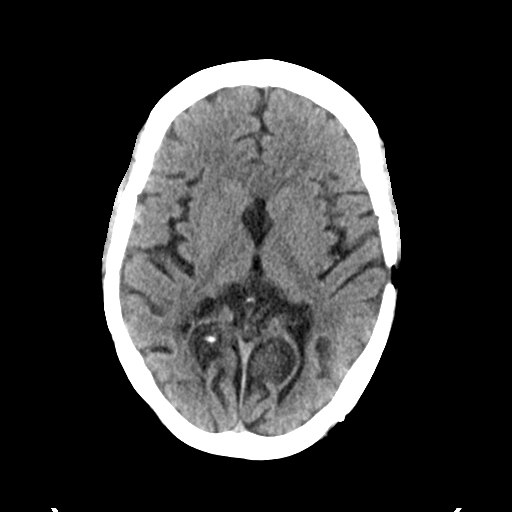
[im 18/35  brain]
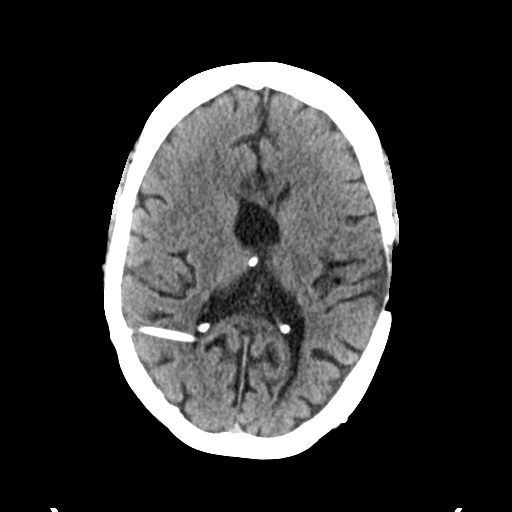
[im 19/35  brain]
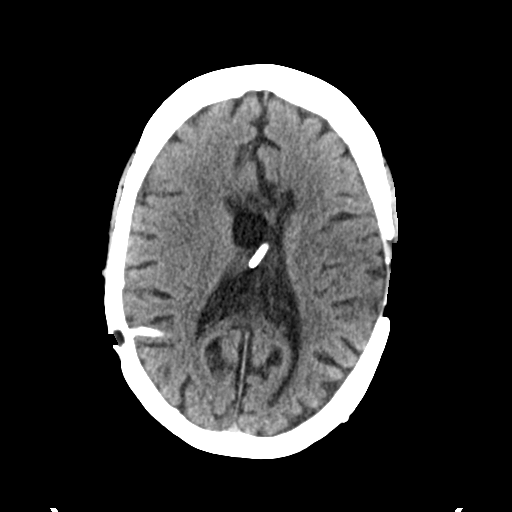
[im 19/35  bone]
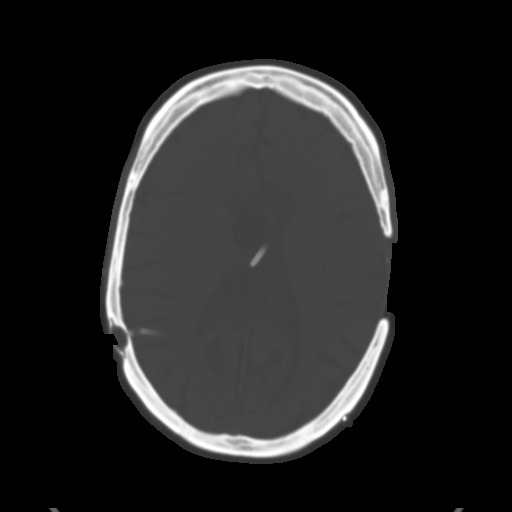
[im 22/35  brain]
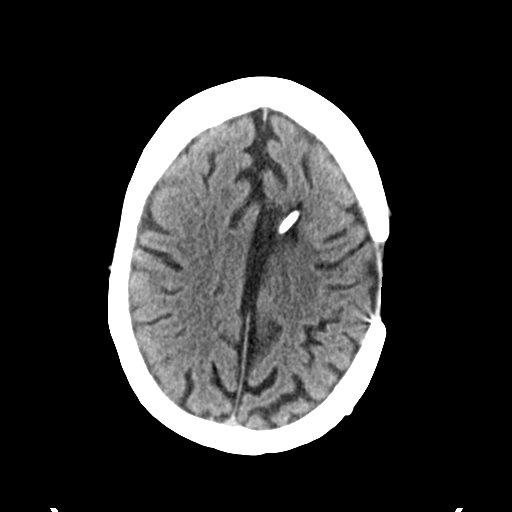
[im 24/35  brain]
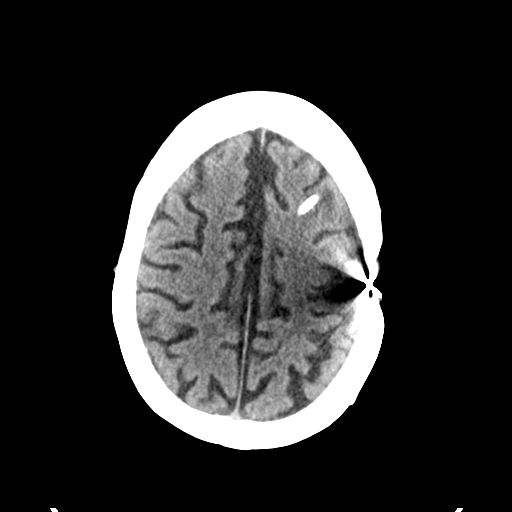
[im 26/35  brain]
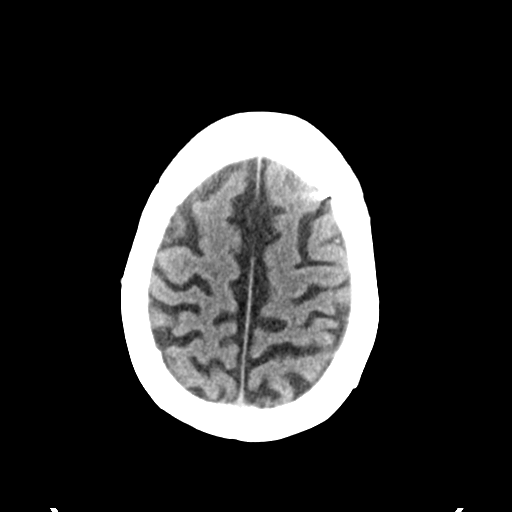
[im 29/35  brain]
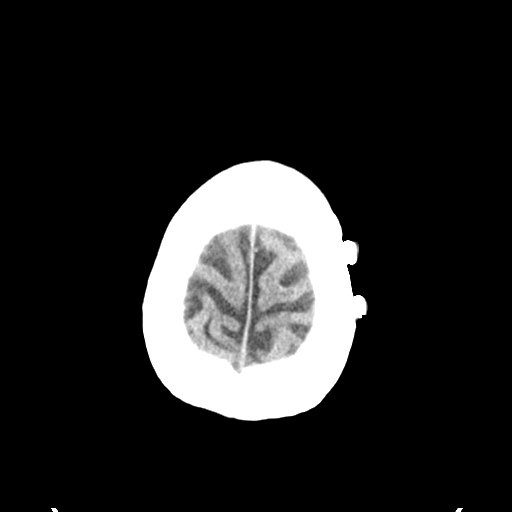
[im 29/35  bone]
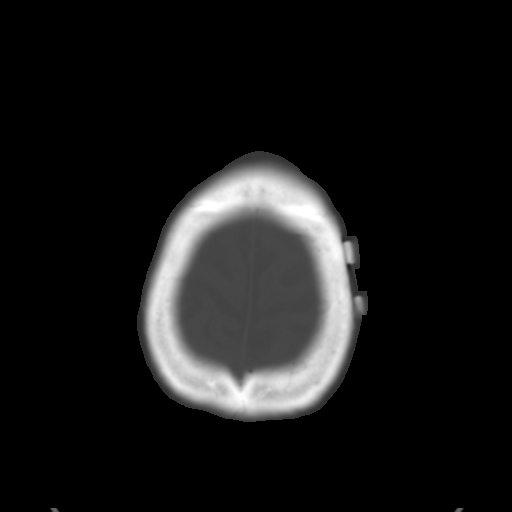
[im 31/35  brain]
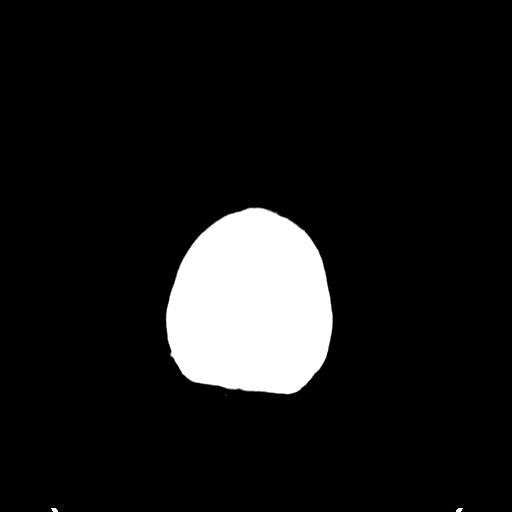
[im 33/35  brain]
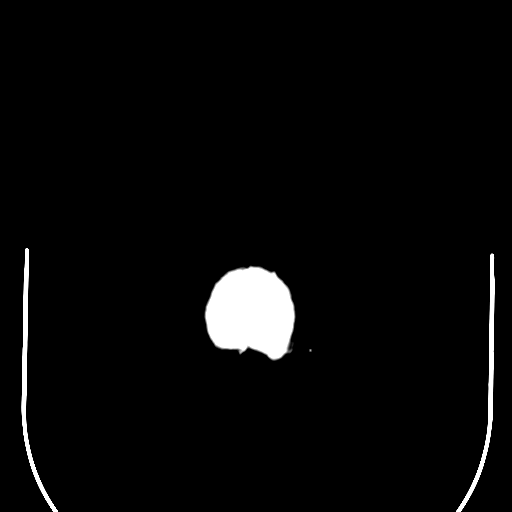

[15 of 30 positions shown; findings below may reference images not displayed]

FINDINGS: The patient has had a previous left frontoparietal craniotomy. There
is a shunt catheter extending from the left frontal region with its
tip in the right lateral ventricle, stable. A second shunt catheter
placed from a right occipital approach has its tip medial to the
atrium of the right lateral ventricle, stable. The ventricles are
decompressed. There is no appreciable hemorrhage, extra-axial fluid
collection, or midline shift. There is an apparent arachnoid cyst
slightly superior and anterior to the third ventricle measuring
x 2.2 cm, stable. There is no other mass. There is evidence of prior
infarct anterior to the lateral ventricles. There is no demonstrable
corpus callosum on this study.

The bony calvarium appears intact except for postoperative defects.
Mastoid air cells are clear.
IMPRESSION: Stable study compared to 9505 examination. Shunt catheter tips are
unchanged in position. The ventricles are decompressed. There is an
apparent arachnoid cyst slightly superior anterior to the third
ventricle, stable. There is no hemorrhage or mass effect. There is
apparent agenesis of corpus callosum. There is evidence of a prior
infarct anterior to the lateral ventricles. There is no new
gray-white compartment lesion. No hemorrhage or extra-axial fluid.
Postoperative bony defects appear stable.

## 2014-08-09 IMAGING — CR DG CHEST 1V
1 series · 1 of 1 positions shown · non-contrast
Comparison: 09/05/2007

CLINICAL DATA: Headache and dizziness.  Ventriculoperitoneal shunt.

EXAM:
CHEST - 1 VIEW

[t chest supine]
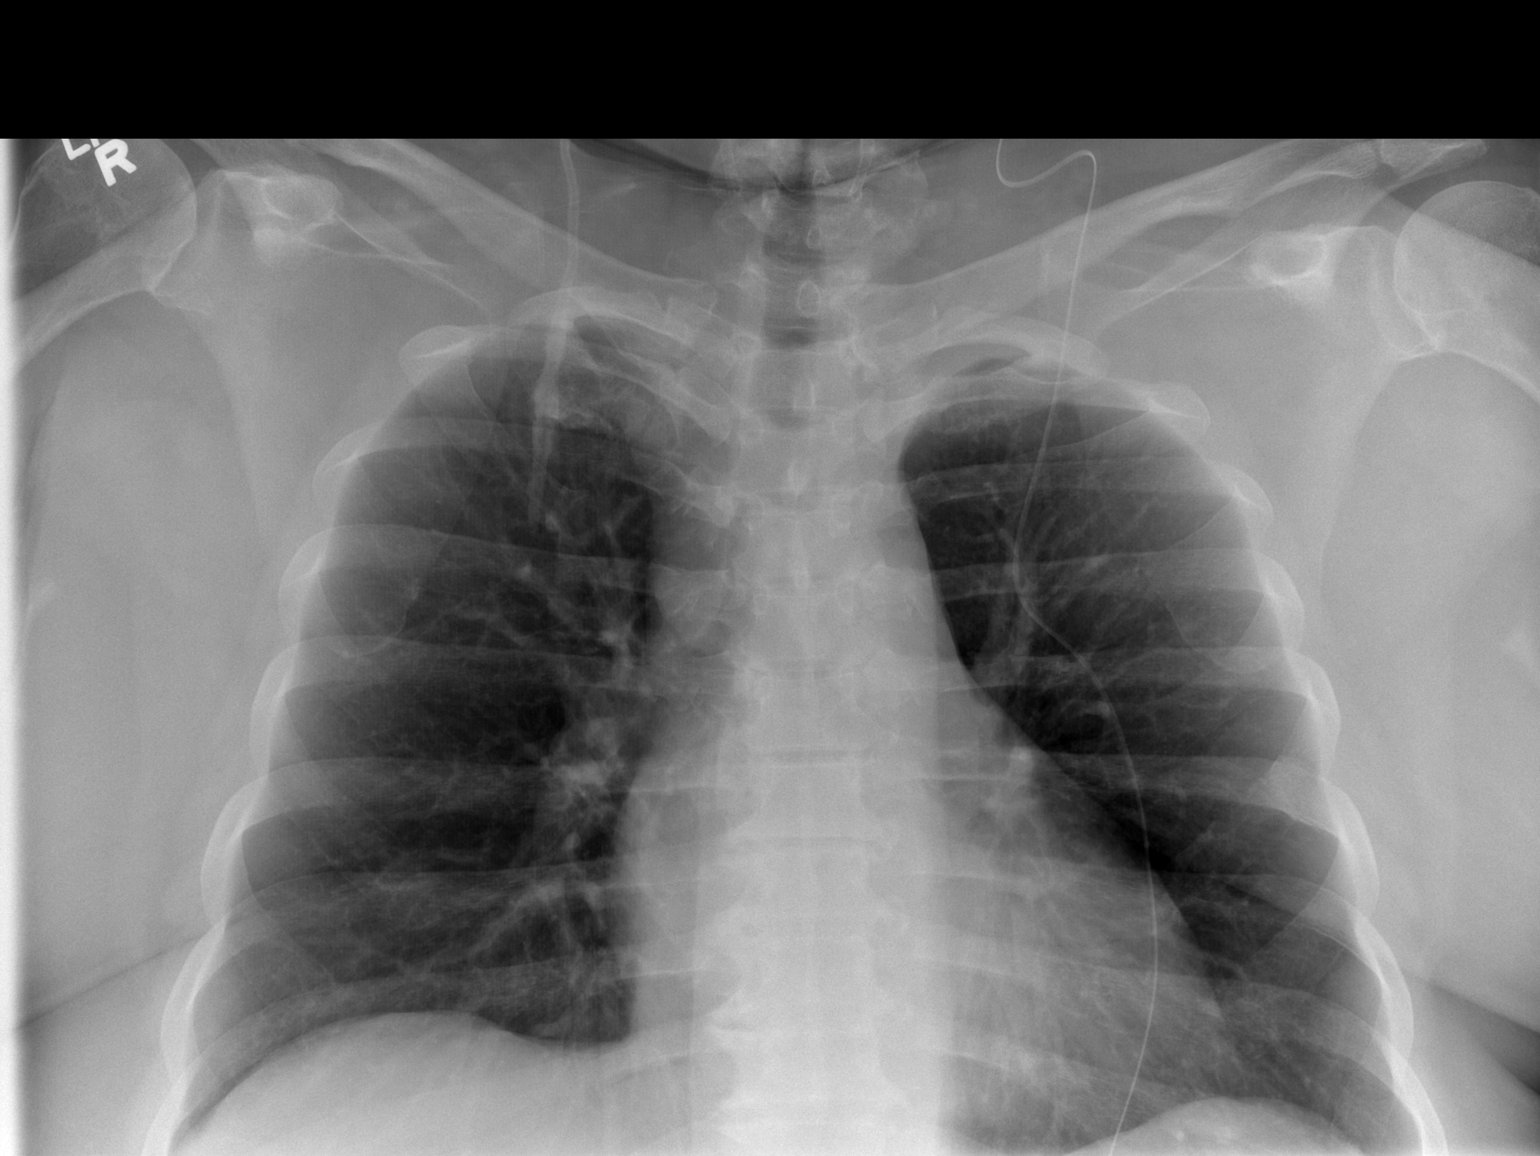

[1 of 1 positions shown; findings below may reference images not displayed]

FINDINGS: Ventriculoperitoneal shunt is noted across the left side of the
chest. The shunt is intact. Heart and pulmonary vascularity are
normal and the lungs are clear. There are remnants of a shunt on the
right.
IMPRESSION: Left-sided shunt is intact.  Shunt remnants on the right.

## 2014-08-10 IMAGING — US US SOFT TISSUE HEAD/NECK
1 series · 14 of 25 positions shown · non-contrast
Comparison: None.

CLINICAL DATA: THYROID NODULE

EXAM:
THYROID ULTRASOUND
TECHNIQUE: Ultrasound examination of the thyroid gland and adjacent soft
tissues was performed.

[Series 1: us soft tissue head/neck · 0.10mm/px · 14 of 66 slices shown]
[im 1/66]
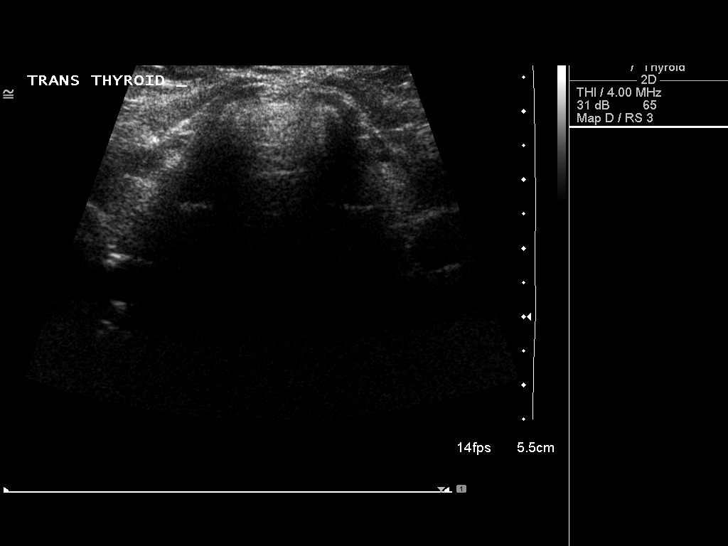
[im 6/66]
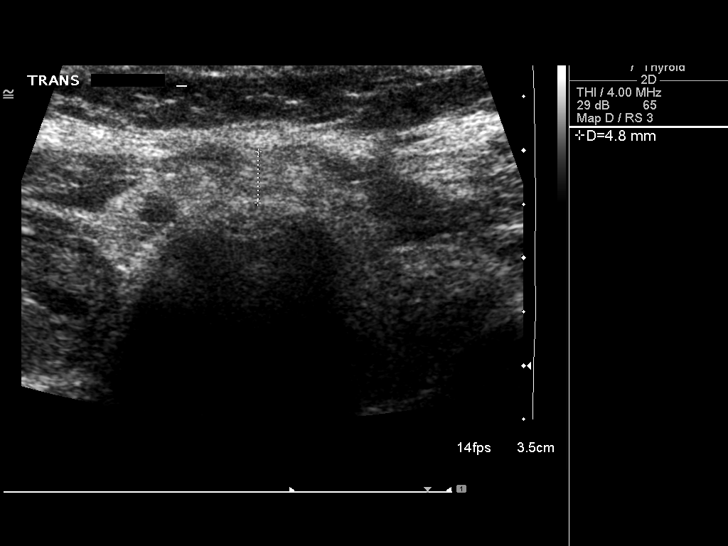
[im 11/66]
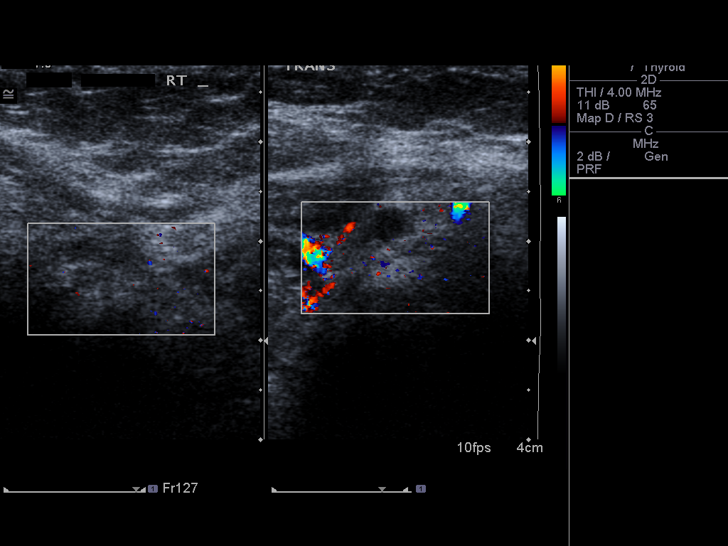
[im 17/66]
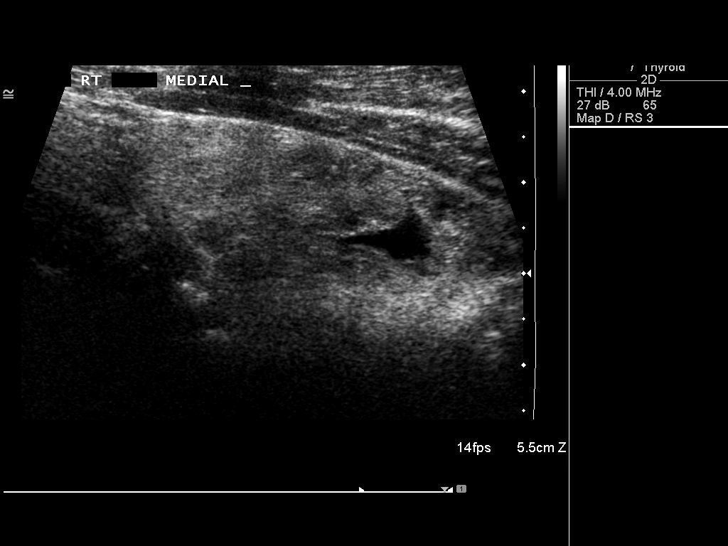
[im 22/66]
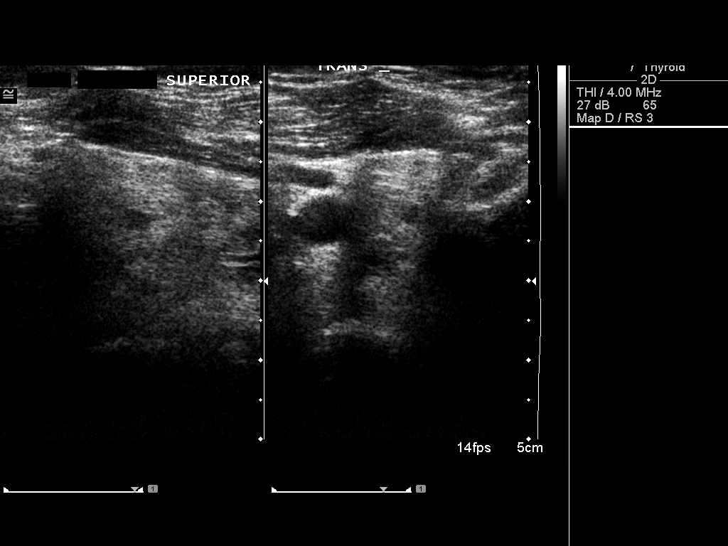
[im 25/66]
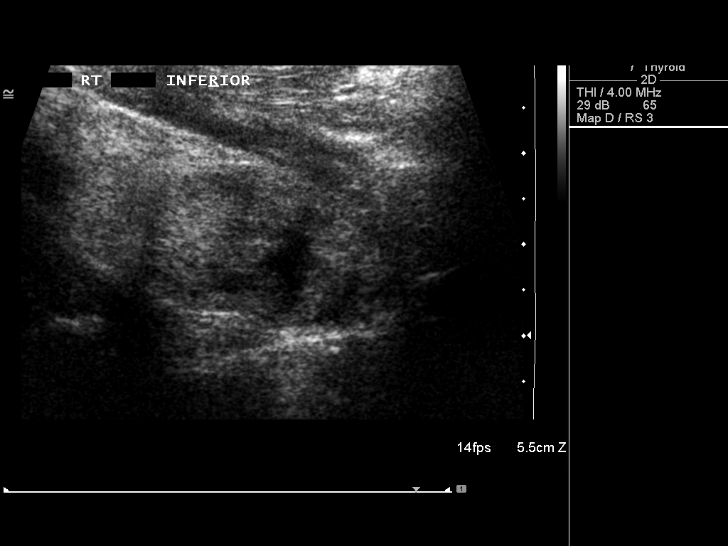
[im 30/66]
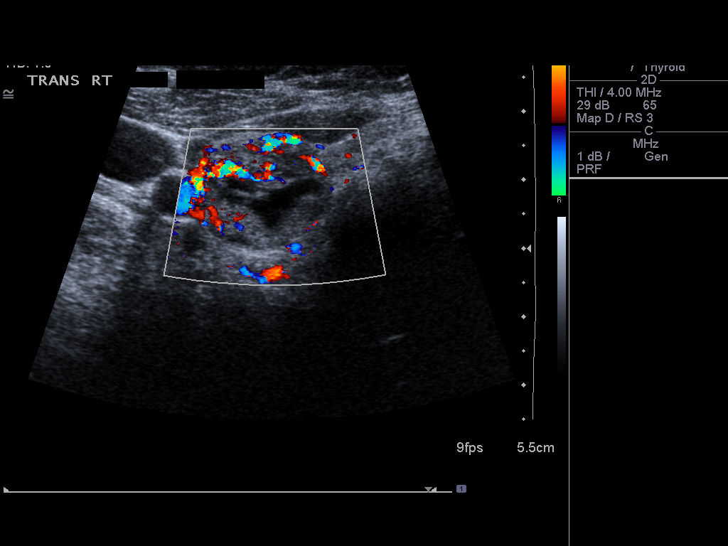
[im 36/66]
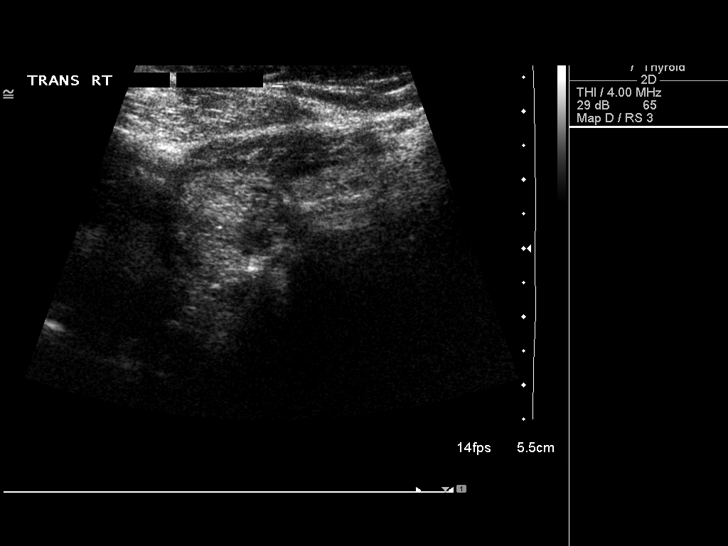
[im 41/66]
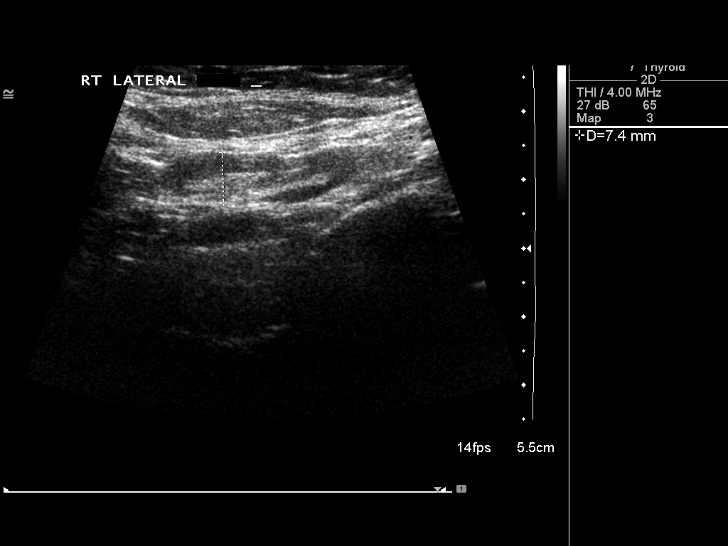
[im 44/66]
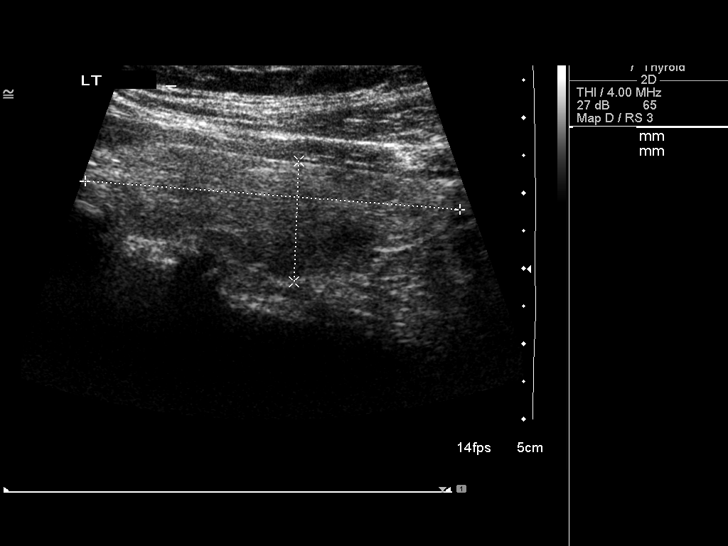
[im 49/66]
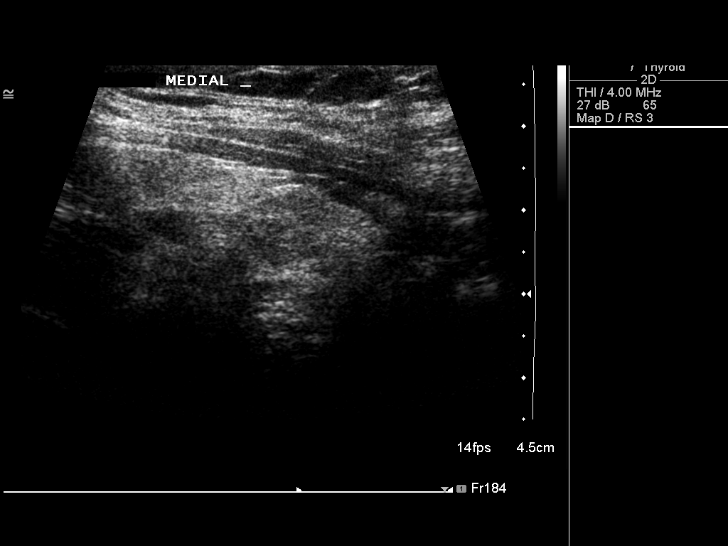
[im 55/66]
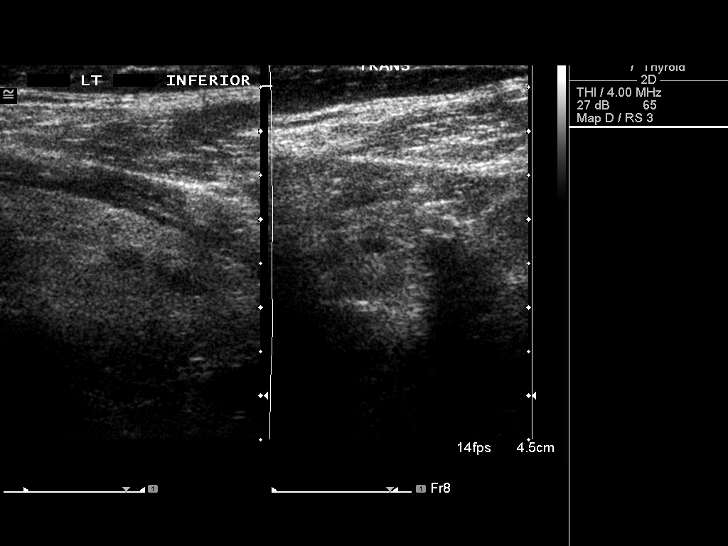
[im 60/66]
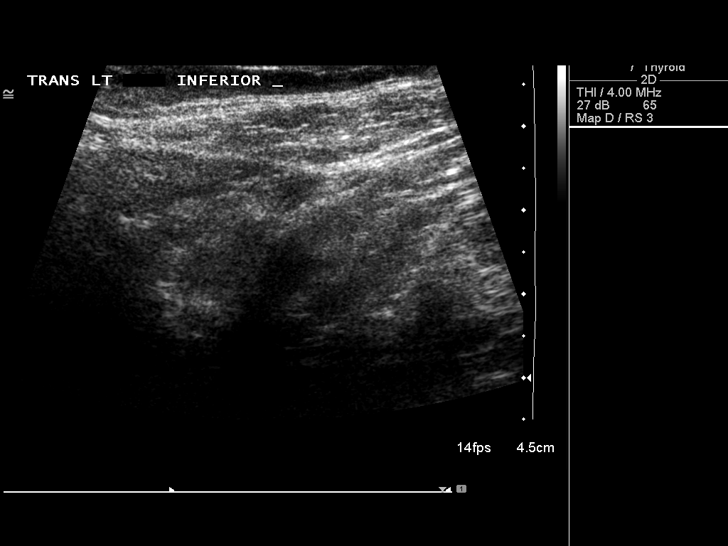
[im 66/66]
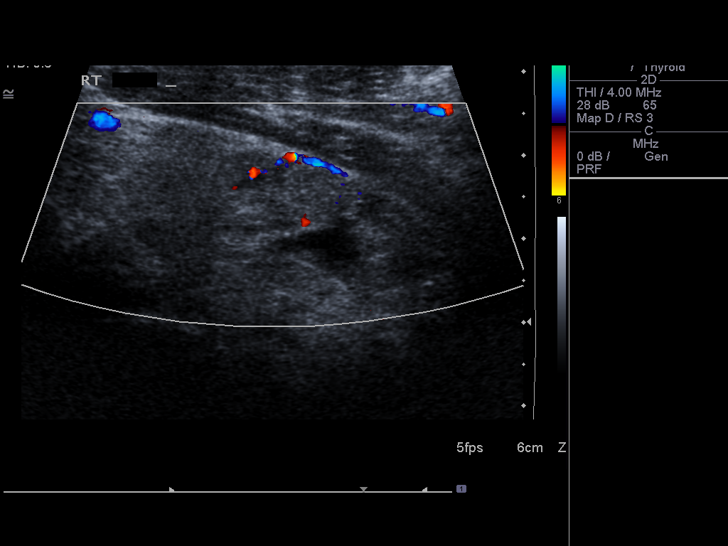

[14 of 25 positions shown; findings below may reference images not displayed]

FINDINGS: Right thyroid lobe

Measurements: 49 x 18 x 24 mm. Several nodules. 5 x 5 x 6 mm
hypoechoic, superior pole. Adjacent 4 x 3 mm hypoechoic lesion.
Dominant 30 x 18 x 20 mm solid nodule, inferior pole.

Left thyroid lobe

Measurements: 50 x 16 x 19 mm. 9 x 5 x 6 mm solid nodule with coarse
calcifications, inferior pole. 6 x 4 mm hypoechoic, inferior pole.

Isthmus

Thickness: 5 mm.  8 x 4 x 6 mm hypoechoic, right of midline.

Lymphadenopathy

None visualized.
IMPRESSION: 1. Multiples small thyroid nodules. The dominant right lesion meets
bump consensus criteria for biopsy. Ultrasound-guided fine needle
aspiration should be considered, as per the consensus statement:
Management of Thyroid Nodules Detected at US: Society of
Radiologists in Ultrasound Consensus Conference Statement. Radiology

## 2014-08-14 IMAGING — US US THYROID BIOPSY
1 series · 13 of 13 positions shown · non-contrast
Comparison: Prior thyroid ultrasound 09/01/2013

CLINICAL DATA: 52-year-old female with dominant right thyroid
nodule which meet consensus criteria for ultrasound-guided FNA
biopsy

EXAM:
ULTRASOUND GUIDED NEEDLE ASPIRATE BIOPSY OF THE THYROID GLAND

[Series 1: us thyroid biopsy · 0.07mm/px · 13 acquisitions, 13 frames shown]
[im 1/13]
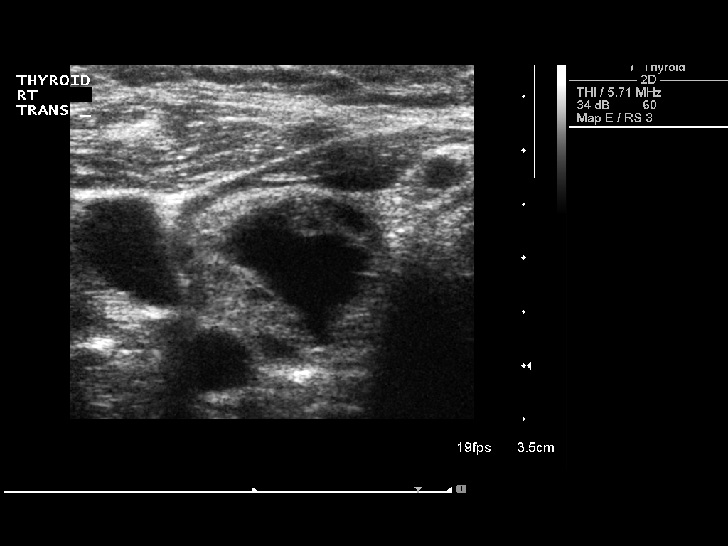
[im 2/13]
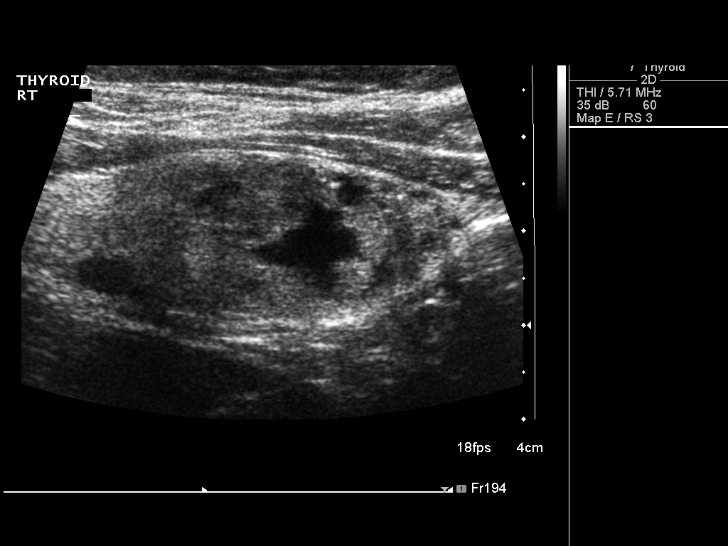
[im 3/13]
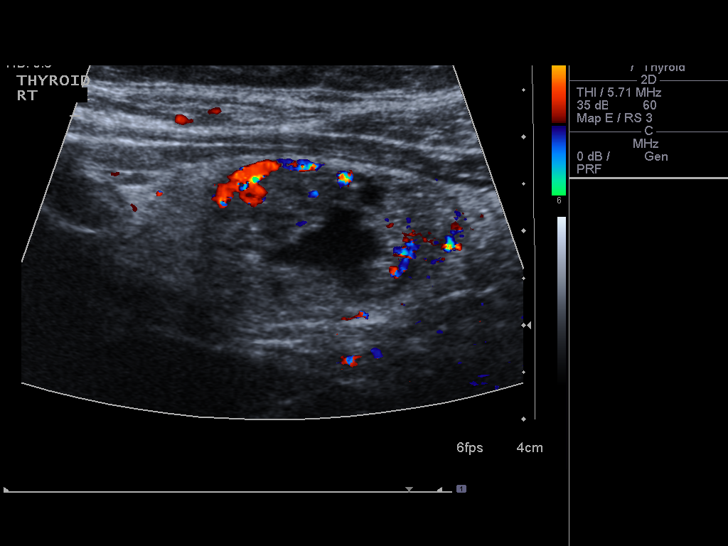
[im 4/13]
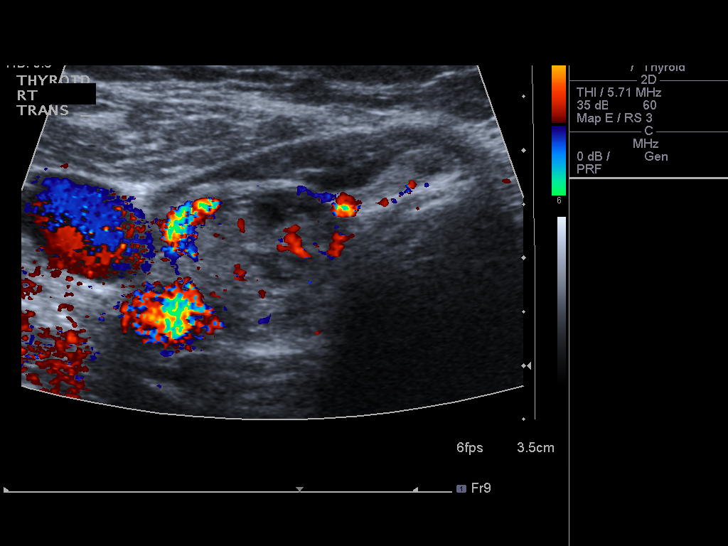
[im 5/13]
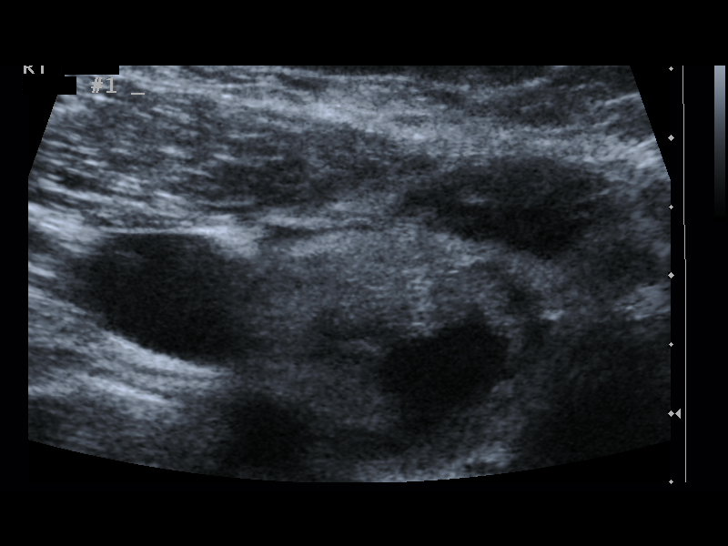
[im 6/13]
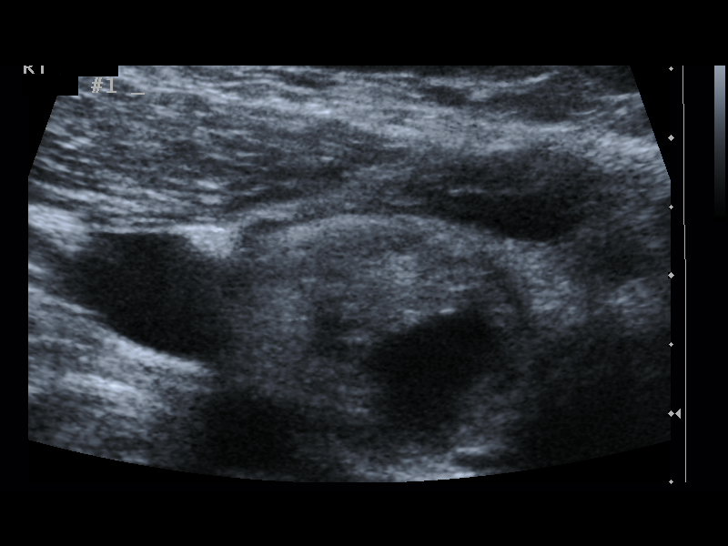
[im 7/13]
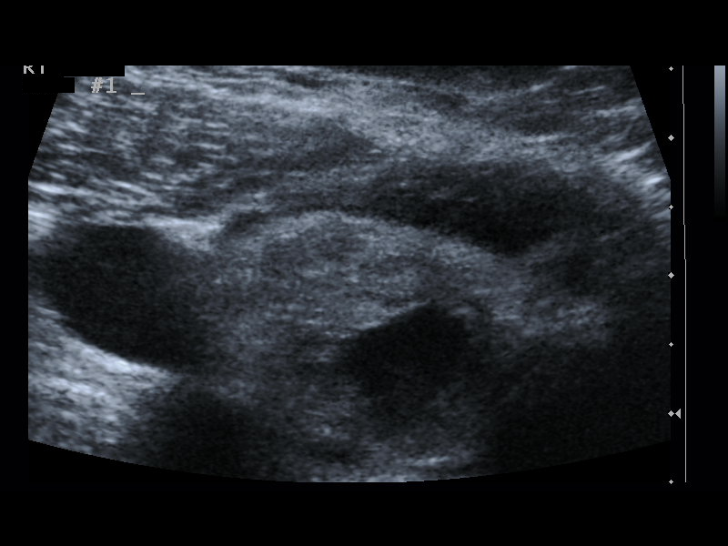
[im 8/13]
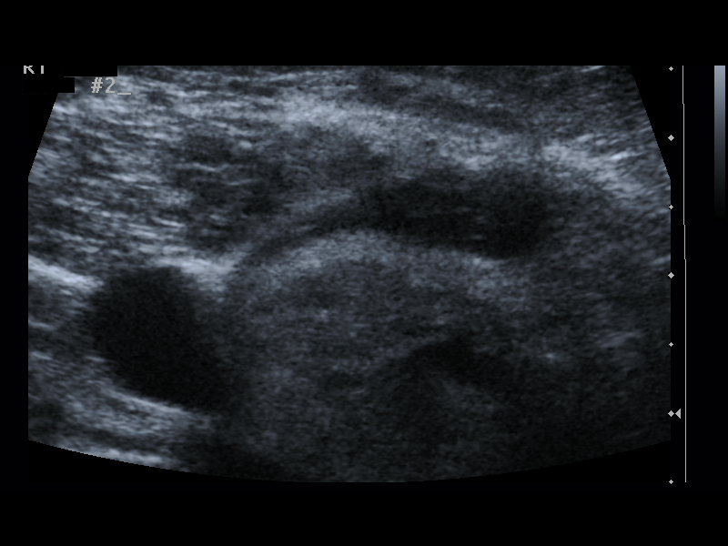
[im 9/13]
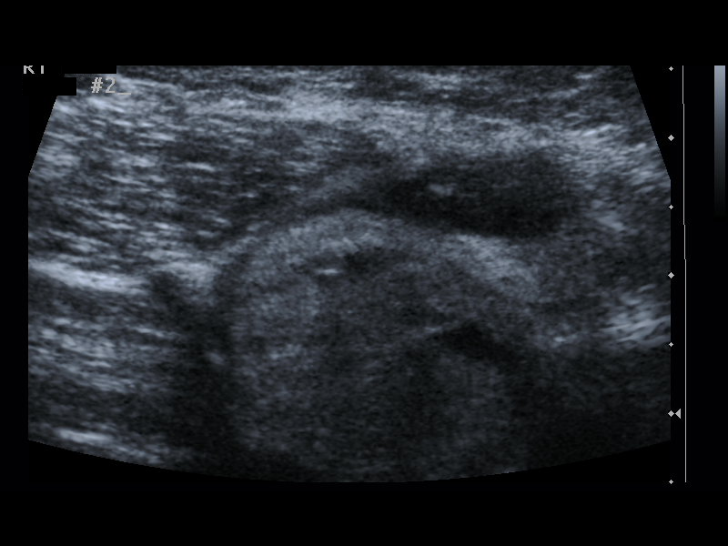
[im 10/13]
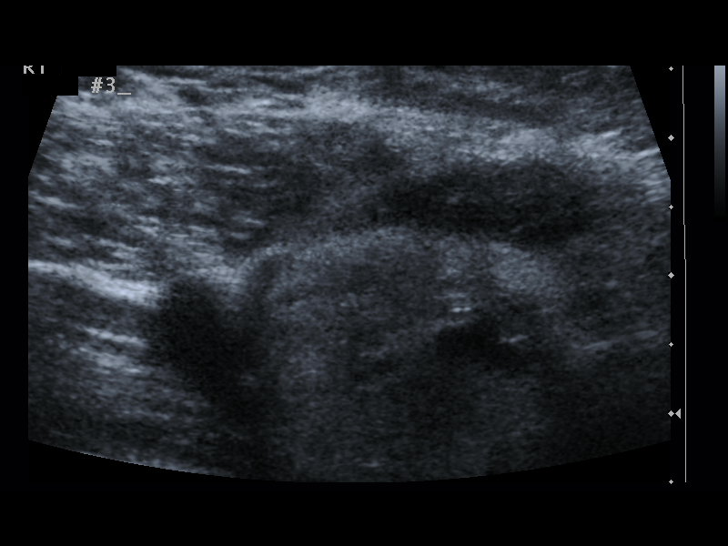
[im 11/13]
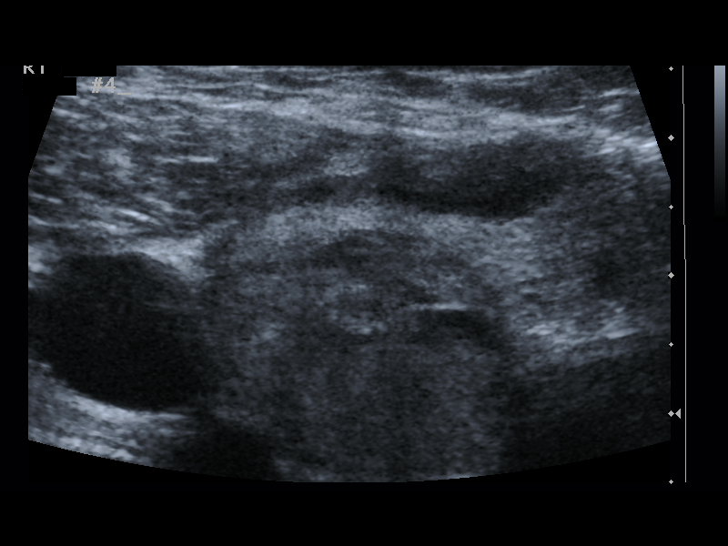
[im 12/13]
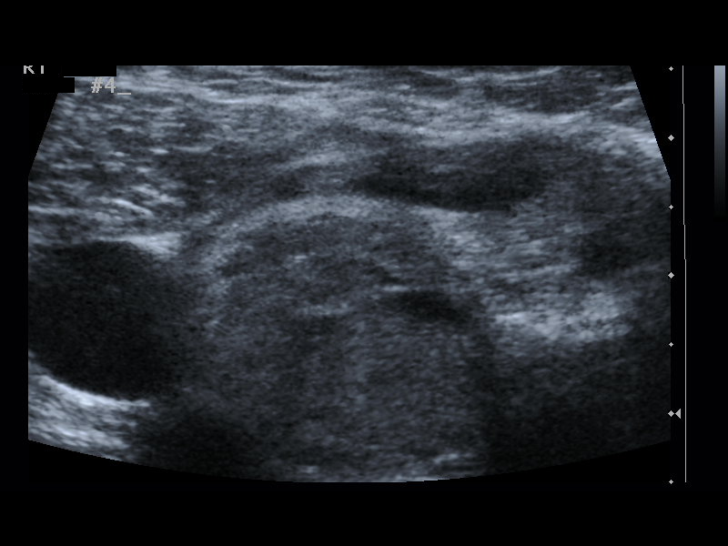
[im 13/13]
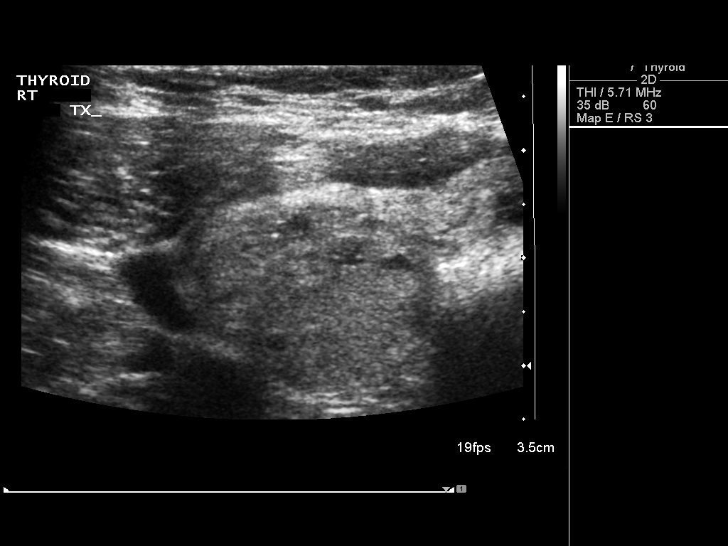

[13 of 13 positions shown; findings below may reference images not displayed]

PROCEDURE:
Thyroid biopsy was thoroughly discussed with the patient and
questions were answered. The benefits, risks, alternatives, and
complications were also discussed. The patient understands and
wishes to proceed with the procedure. Written consent was obtained.

Ultrasound was performed to localize and mark an adequate site for
the biopsy. The patient was then prepped and draped in a normal
sterile fashion. Local anesthesia was provided with 1% lidocaine.
Using direct ultrasound guidance, 4 passes were made using needles
into the nodule within the Right Lobe of the thyroid. Ultrasound was
used to confirm needle placements on all occasions. Specimens were
sent to Pathology for analysis.

Complications:  None
FINDINGS: Successful identification of 3 cm mixed cystic and solid nodule in
the inferior right gland
IMPRESSION: Ultrasound guided needle aspirate biopsy performed of the right
thyroid nodule.

## 2014-08-24 ENCOUNTER — Other Ambulatory Visit: Payer: Self-pay

## 2014-08-24 DIAGNOSIS — Z1231 Encounter for screening mammogram for malignant neoplasm of breast: Secondary | ICD-10-CM

## 2014-09-24 ENCOUNTER — Ambulatory Visit: Payer: Medicare Other | Admitting: Neurology

## 2014-09-27 ENCOUNTER — Ambulatory Visit
Admission: RE | Admit: 2014-09-27 | Discharge: 2014-09-27 | Disposition: A | Discharge status: Home / Self Care | Payer: Medicare Other | Source: Ambulatory Visit

## 2014-09-27 DIAGNOSIS — Z1231 Encounter for screening mammogram for malignant neoplasm of breast: Secondary | ICD-10-CM

## 2015-06-04 ENCOUNTER — Other Ambulatory Visit: Payer: Self-pay | Admitting: Dermatology

## 2015-06-27 ENCOUNTER — Other Ambulatory Visit: Payer: Self-pay | Admitting: Dermatology

## 2015-07-16 ENCOUNTER — Other Ambulatory Visit: Payer: Self-pay | Admitting: Family Medicine

## 2015-07-16 DIAGNOSIS — E042 Nontoxic multinodular goiter: Secondary | ICD-10-CM

## 2015-07-17 ENCOUNTER — Ambulatory Visit
Admission: RE | Admit: 2015-07-17 | Discharge: 2015-07-17 | Disposition: A | Discharge status: Home / Self Care | Payer: Medicare Other | Source: Ambulatory Visit | Attending: Family Medicine | Admitting: Family Medicine

## 2015-07-17 DIAGNOSIS — E042 Nontoxic multinodular goiter: Secondary | ICD-10-CM

## 2015-09-09 ENCOUNTER — Other Ambulatory Visit: Payer: Self-pay | Admitting: Family Medicine

## 2015-09-09 DIAGNOSIS — Z1231 Encounter for screening mammogram for malignant neoplasm of breast: Secondary | ICD-10-CM

## 2015-09-30 ENCOUNTER — Encounter: Payer: Self-pay | Admitting: Radiology

## 2015-09-30 ENCOUNTER — Ambulatory Visit
Admission: RE | Admit: 2015-09-30 | Discharge: 2015-09-30 | Disposition: A | Discharge status: Home / Self Care | Payer: Medicare Other | Source: Ambulatory Visit | Attending: Family Medicine | Admitting: Family Medicine

## 2015-09-30 DIAGNOSIS — Z1231 Encounter for screening mammogram for malignant neoplasm of breast: Secondary | ICD-10-CM

## 2016-04-23 DIAGNOSIS — J329 Chronic sinusitis, unspecified: Secondary | ICD-10-CM | POA: Diagnosis not present

## 2016-06-01 DIAGNOSIS — G919 Hydrocephalus, unspecified: Secondary | ICD-10-CM | POA: Diagnosis not present

## 2016-06-01 DIAGNOSIS — J309 Allergic rhinitis, unspecified: Secondary | ICD-10-CM | POA: Diagnosis not present

## 2016-06-24 IMAGING — US US THYROID
1 series · 13 of 25 positions shown · non-contrast
Comparison: 09/05/2013, 09/01/2013

CLINICAL DATA: 54-year-old female with a history of thyroid
nodules.

Ultrasound-guided biopsy of right thyroid nodule 09/05/2013.
Pathology report designates benign follicular nodule
EXAM:
THYROID ULTRASOUND
TECHNIQUE: Ultrasound examination of the thyroid gland and adjacent soft
tissues was performed.

[Series 1: us thyroid · 0.10mm/px · 13 of 67 slices shown]
[im 1/67]
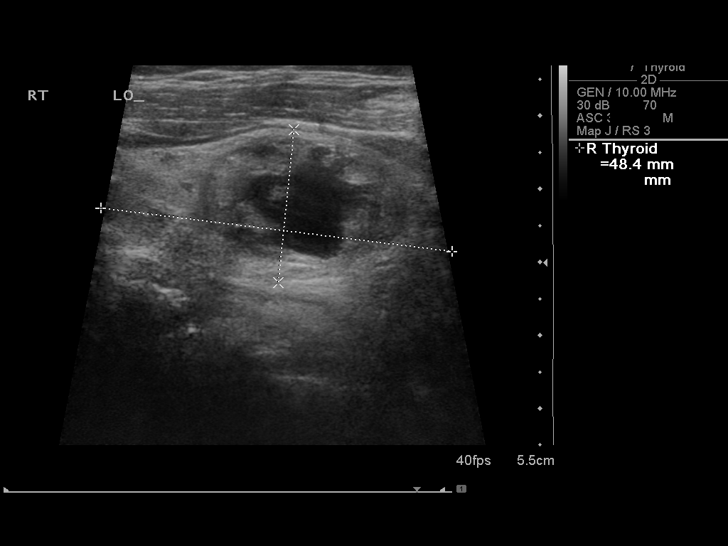
[im 6/67]
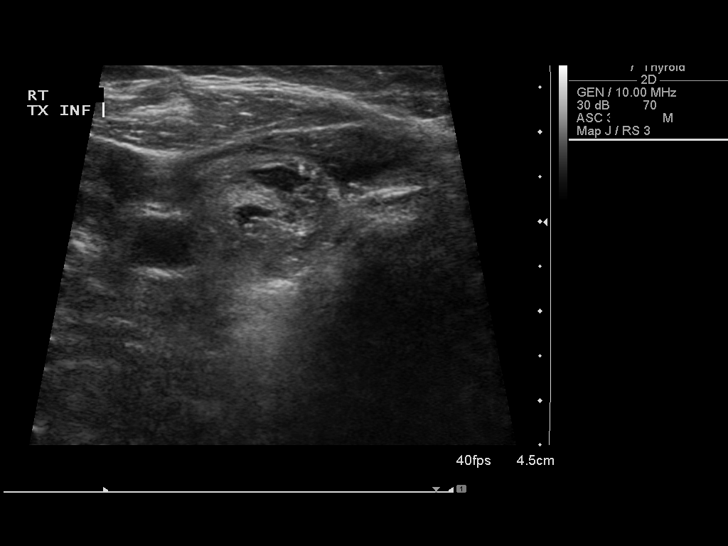
[im 12/67]
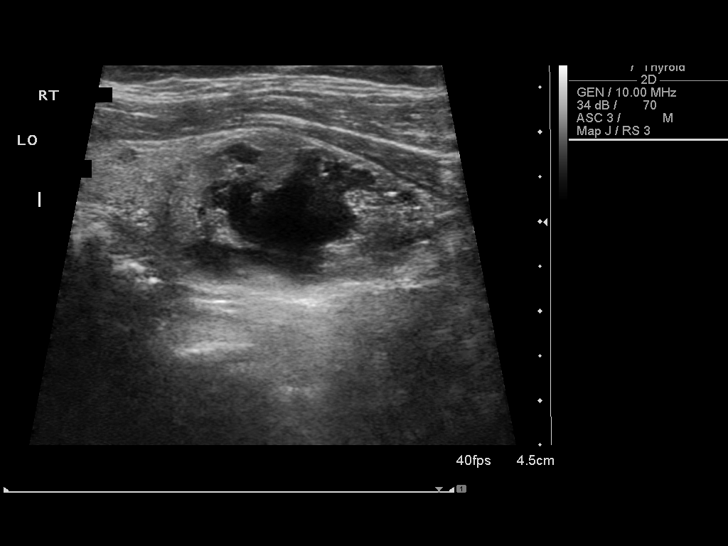
[im 17/67]
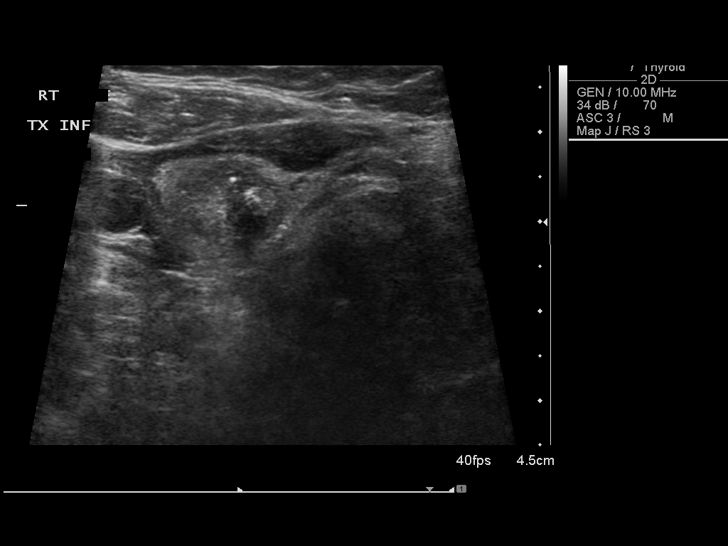
[im 23/67]
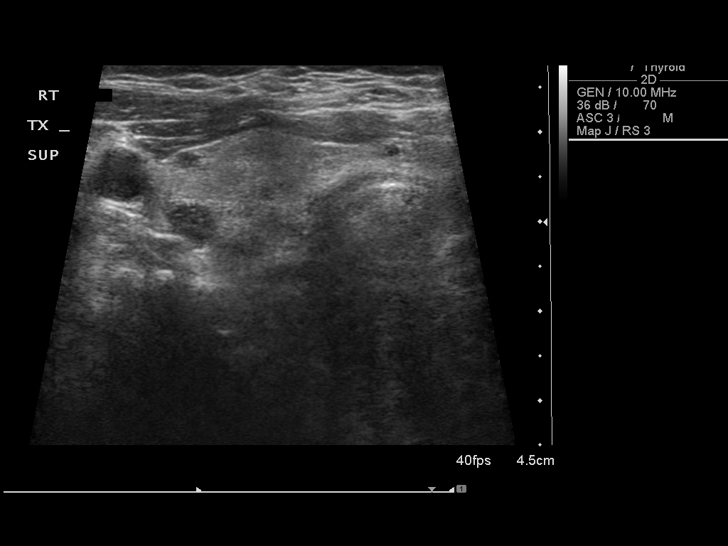
[im 28/67]
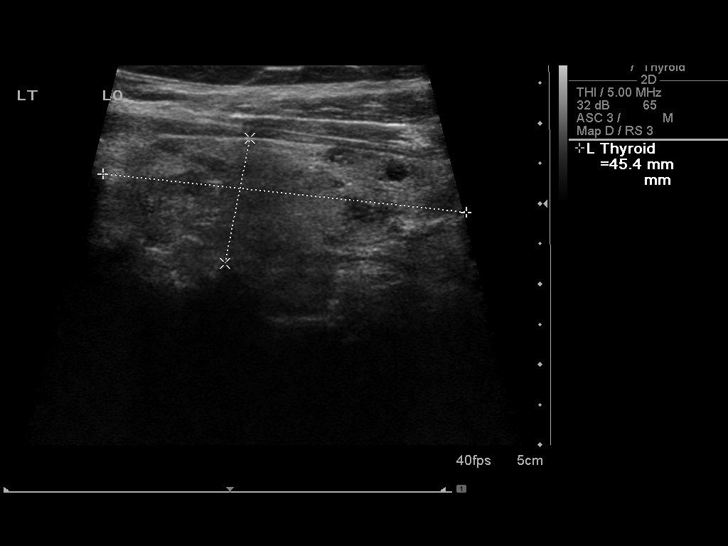
[im 34/67]
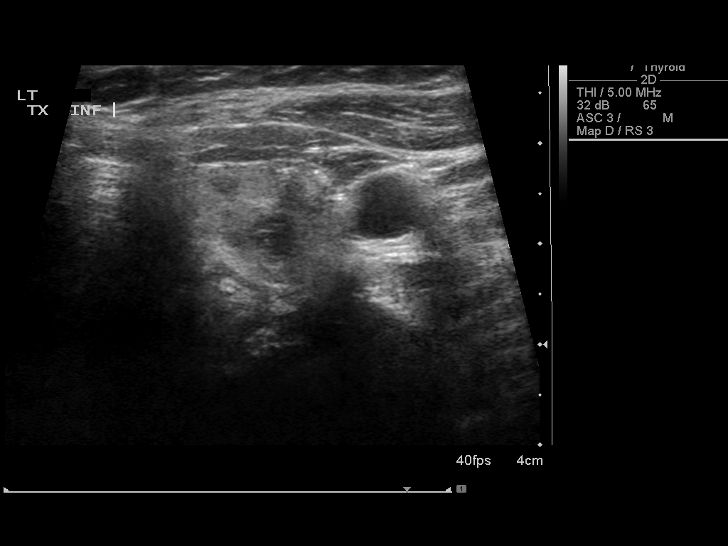
[im 39/67]
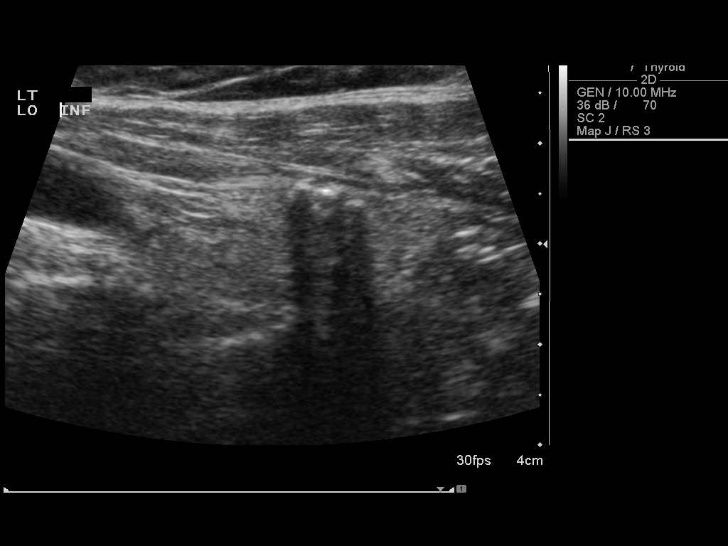
[im 45/67]
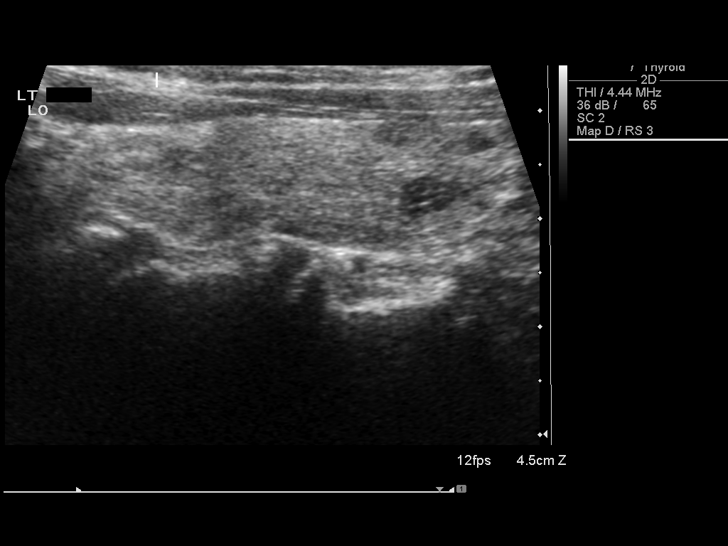
[im 50/67]
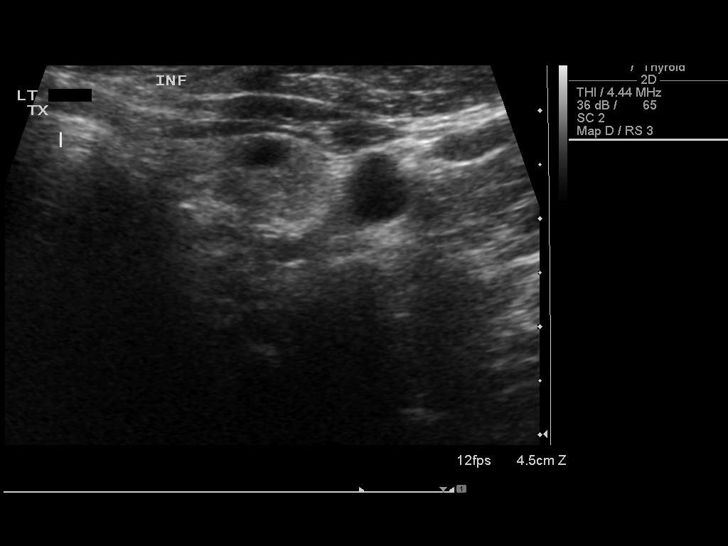
[im 56/67]
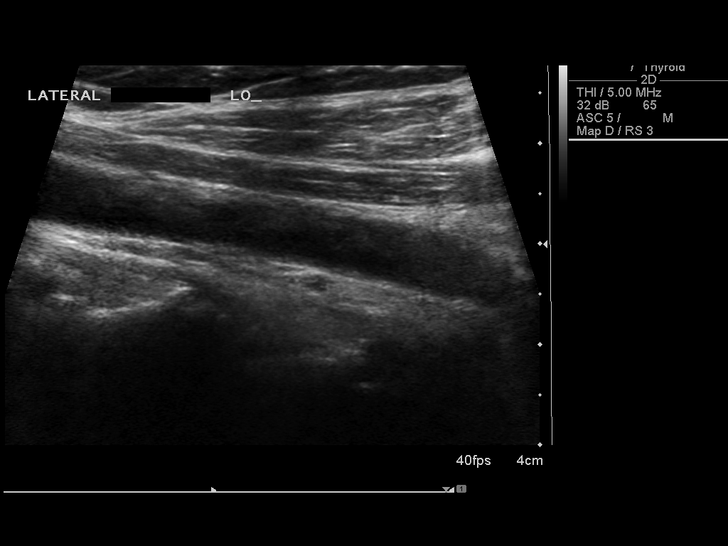
[im 61/67]
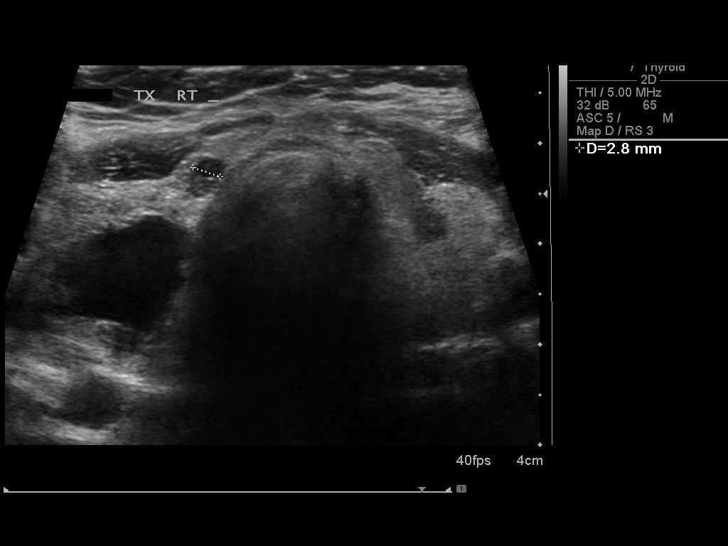
[im 67/67]
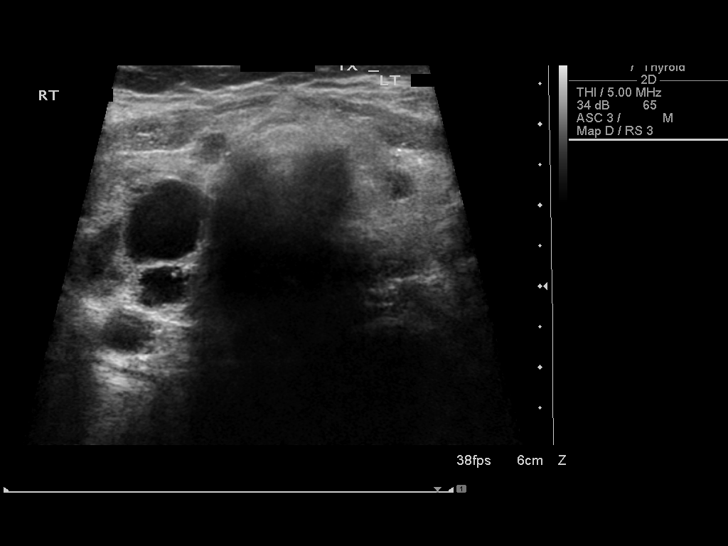

[13 of 25 positions shown; findings below may reference images not displayed]

FINDINGS: Right thyroid lobe

Measurements: 4.8 cm x 2.1 cm x 2.3 cm. Heterogeneous appearance of
right-sided thyroid tissue.

Mid right nodule #1 measures 3.4 cm x 1.7 cm x 2.1 cm mixed cystic
and solid composition (1), with isoechoic echogenicity (1), wider
than tall shape (0), smooth margin (0), with no internal
echogenicity (0). Total ACR points 2 for TI-RADS level of TR 2.

Right superior nodule #2 measures 0.7 cm x 0.5 cm by 0.5 cm Nearly
completely solid composition (2), with hypoechoic echogenicity (2),
wider than tall shape (0), smooth margin (0), with small internal
echogenicity (3). Total ACR points 5 for TI-RADS level of TR 5.

Left thyroid lobe

Measurements: 4.5 cm x 1.6 cm x 1.6 cm. Heterogeneous appearance of
left-sided thyroid tissue.

Inferior left nodule #1 measures 0.9 cm x 0.7 cm x 0.6 cm Nearly
completely solid composition (2), with hypoechoic echogenicity (2),
wider than tall shape (0), irregular margin (1), with macro
calcification (1). Total ACR points 6 for TI-RADS level of TR 6.

Inferior medial left nodule #2 measures 0.7 cm x 0.5 cm x 0.4 cm
nearly completely cystic composition (0), with anechoic echogenicity
(0), wider than tall shape (0), smooth margin (0), with no internal
echogenic foci (0). Total ACR points 0 for TI-RADS level of TR 1.

Isthmus

Thickness: 0.3 cm.  No nodules visualized.

Lymphadenopathy

None visualized.
IMPRESSION: Multinodular thyroid again demonstrated, with benign pathology from
a prior right-sided thyroid nodule biopsy.

Majority of the nodules do not require biopsy or surveillance,
however, the smaller right superior thyroid nodule meets criteria
for surveillance, as designated by the newly established ACR TI-RADS
criteria. Given the appearance, annual surveillance ultrasound study
recommended for up to 5 years.

Recommendations follow those established by the new ACR TI-RADS
criteria ([HOSPITAL] 9229;[DATE]).

## 2016-07-20 ENCOUNTER — Other Ambulatory Visit: Payer: Self-pay | Admitting: Family Medicine

## 2016-07-20 DIAGNOSIS — E042 Nontoxic multinodular goiter: Secondary | ICD-10-CM

## 2016-07-20 DIAGNOSIS — Z1231 Encounter for screening mammogram for malignant neoplasm of breast: Secondary | ICD-10-CM

## 2016-07-29 ENCOUNTER — Ambulatory Visit
Admission: RE | Admit: 2016-07-29 | Discharge: 2016-07-29 | Disposition: A | Discharge status: Home / Self Care | Payer: Self-pay | Source: Ambulatory Visit | Attending: Family Medicine | Admitting: Family Medicine

## 2016-07-29 DIAGNOSIS — E042 Nontoxic multinodular goiter: Secondary | ICD-10-CM

## 2016-07-31 DIAGNOSIS — Z79899 Other long term (current) drug therapy: Secondary | ICD-10-CM | POA: Diagnosis not present

## 2016-07-31 DIAGNOSIS — J329 Chronic sinusitis, unspecified: Secondary | ICD-10-CM | POA: Diagnosis not present

## 2016-07-31 DIAGNOSIS — I10 Essential (primary) hypertension: Secondary | ICD-10-CM | POA: Diagnosis not present

## 2016-07-31 DIAGNOSIS — Z1159 Encounter for screening for other viral diseases: Secondary | ICD-10-CM | POA: Diagnosis not present

## 2016-07-31 DIAGNOSIS — L304 Erythema intertrigo: Secondary | ICD-10-CM | POA: Diagnosis not present

## 2016-07-31 DIAGNOSIS — E559 Vitamin D deficiency, unspecified: Secondary | ICD-10-CM | POA: Diagnosis not present

## 2016-07-31 DIAGNOSIS — E785 Hyperlipidemia, unspecified: Secondary | ICD-10-CM | POA: Diagnosis not present

## 2016-07-31 DIAGNOSIS — E042 Nontoxic multinodular goiter: Secondary | ICD-10-CM | POA: Diagnosis not present

## 2016-07-31 DIAGNOSIS — R69 Illness, unspecified: Secondary | ICD-10-CM | POA: Diagnosis not present

## 2016-07-31 DIAGNOSIS — Z0000 Encounter for general adult medical examination without abnormal findings: Secondary | ICD-10-CM | POA: Diagnosis not present

## 2016-08-19 DIAGNOSIS — L309 Dermatitis, unspecified: Secondary | ICD-10-CM | POA: Diagnosis not present

## 2016-09-21 DIAGNOSIS — Z8669 Personal history of other diseases of the nervous system and sense organs: Secondary | ICD-10-CM | POA: Diagnosis not present

## 2016-09-21 DIAGNOSIS — H5213 Myopia, bilateral: Secondary | ICD-10-CM | POA: Diagnosis not present

## 2016-09-21 DIAGNOSIS — H4720 Unspecified optic atrophy: Secondary | ICD-10-CM | POA: Diagnosis not present

## 2016-09-24 DIAGNOSIS — J019 Acute sinusitis, unspecified: Secondary | ICD-10-CM | POA: Diagnosis not present

## 2016-09-24 DIAGNOSIS — Z23 Encounter for immunization: Secondary | ICD-10-CM | POA: Diagnosis not present

## 2016-09-30 ENCOUNTER — Ambulatory Visit
Admission: RE | Admit: 2016-09-30 | Discharge: 2016-09-30 | Disposition: A | Discharge status: Home / Self Care | Payer: Medicare HMO | Source: Ambulatory Visit | Attending: Family Medicine | Admitting: Family Medicine

## 2016-09-30 DIAGNOSIS — Z1231 Encounter for screening mammogram for malignant neoplasm of breast: Secondary | ICD-10-CM

## 2016-11-05 DIAGNOSIS — J329 Chronic sinusitis, unspecified: Secondary | ICD-10-CM | POA: Diagnosis not present

## 2017-01-15 DIAGNOSIS — J309 Allergic rhinitis, unspecified: Secondary | ICD-10-CM | POA: Diagnosis not present

## 2017-03-09 DIAGNOSIS — J329 Chronic sinusitis, unspecified: Secondary | ICD-10-CM | POA: Diagnosis not present

## 2017-04-08 DIAGNOSIS — M79671 Pain in right foot: Secondary | ICD-10-CM | POA: Diagnosis not present

## 2017-04-08 DIAGNOSIS — S92344A Nondisplaced fracture of fourth metatarsal bone, right foot, initial encounter for closed fracture: Secondary | ICD-10-CM | POA: Diagnosis not present

## 2017-04-13 ENCOUNTER — Ambulatory Visit (INDEPENDENT_AMBULATORY_CARE_PROVIDER_SITE_OTHER): Payer: Medicare HMO

## 2017-04-13 ENCOUNTER — Encounter (INDEPENDENT_AMBULATORY_CARE_PROVIDER_SITE_OTHER): Payer: Self-pay | Admitting: Orthopedic Surgery

## 2017-04-13 ENCOUNTER — Ambulatory Visit (INDEPENDENT_AMBULATORY_CARE_PROVIDER_SITE_OTHER): Payer: Medicare HMO | Admitting: Orthopedic Surgery

## 2017-04-13 VITALS — Ht 64.0 in | Wt 320.0 lb

## 2017-04-13 DIAGNOSIS — M79671 Pain in right foot: Secondary | ICD-10-CM | POA: Diagnosis not present

## 2017-04-13 DIAGNOSIS — M84474A Pathological fracture, right foot, initial encounter for fracture: Secondary | ICD-10-CM

## 2017-04-13 NOTE — Progress Notes (Signed)
Office Visit Note   Patient: Stacy Hurst           Date of Birth: [birth year]           MRN: [id number] Visit Date: 04/13/2017              Requested by: Jonathon Jordan, MD [place],  [id number] PCP: Jonathon Jordan, MD  Chief Complaint  Patient presents with  . Right Foot - Pain    Hx tib-cal fusion in 2009      HPI: Patient is a 57 year old woman who is status post tibial calcaneal fusion on the right.  Patient states that she is been having about a month worth of pain in the right foot she has pain along the fourth metatarsal.  Patient feels like she may have dropped something on her foot but she cannot remember anything.  Assessment & Plan: Visit Diagnoses:  1. Pain in right foot   2. Metatarsal fracture, pathologic, right, initial encounter     Plan: Patient has a pair of stiff soled sneakers she will wear these for all ambulation follow-up in 4 weeks with repeat three-view radiographs of the right foot.  Follow-Up Instructions: Return in about 4 weeks (around 05/11/2017).   Ortho Exam  Patient is alert, oriented, no adenopathy, well-dressed, normal affect, normal respiratory effort. Examination patient has a palpable dorsalis pedis pulse.  The fusion site is stable.  She is point tender to palpation along the fourth metatarsal consistent with the stress fracture.  Imaging: Xr Foot Complete Right  Result Date: 04/13/2017 3 view radiographs of the right foot shows stable internal fixation for the tibial calcaneal fusion.  Patient does have some arthritic changes to the midfoot and she has a subacute fracture through the base of the fourth metatarsal with periosteal bony healing.  No images are attached to the encounter.  Labs: No results found for: HGBA1C, ESRSEDRATE, CRP, LABURIC, REPTSTATUS, GRAMSTAIN, CULT, LABORGA  @LABSALLVALUES (HGBA1)@  Body mass index is 54.93 kg/m.  Orders:  Orders Placed This Encounter    Procedures  . XR Foot Complete Right   No orders of the defined types were placed in this encounter.    Procedures: No procedures performed  Clinical Data: No additional findings.  ROS:  All other systems negative, except as noted in the HPI. Review of Systems  Objective: Vital Signs: Ht 5\' 4"  (1.626 m)   Wt (!) 320 lb (145.2 kg)   LMP 04/24/2013   BMI 54.93 kg/m   Specialty Comments:  No specialty comments available.  PMFS History: Patient Active Problem List   Diagnosis Date Noted  . Metatarsal fracture, pathologic, right, initial encounter 04/13/2017  . Hydrocephalus with operating shunt 10/04/2013  . Dizzy spells 09/15/2013  . Dyspnea 09/15/2013  . Abnormal ECG 09/15/2013  . Migraine without aura 07/05/2013   Past Medical History:  Diagnosis Date  . Chronic rhinitis   . Headache(784.0)   . Legal blindness, as defined in [place]   . Obstructive hydrocephalus   . Pure hypercholesterolemia   . Unspecified essential hypertension     Family History  Problem Relation Age of Onset  . Hypertension Mother   . Heart failure Father   . Hypertension Sister   . Breast cancer Neg Hx     Past Surgical History:  Procedure Laterality Date  . ANKLE FUSION    . ROTATOR CUFF REPAIR    . shunt surgery     Social History  Occupational History  . Not on file  Tobacco Use  . Smoking status: Never Smoker  . Smokeless tobacco: Never Used  Substance and Sexual Activity  . Alcohol use: No    Alcohol/week: 0.0 oz  . Drug use: No  . Sexual activity: No

## 2017-05-17 ENCOUNTER — Ambulatory Visit (INDEPENDENT_AMBULATORY_CARE_PROVIDER_SITE_OTHER): Payer: Medicare HMO | Admitting: Orthopedic Surgery

## 2017-06-22 DIAGNOSIS — R51 Headache: Secondary | ICD-10-CM | POA: Diagnosis not present

## 2017-06-22 DIAGNOSIS — J310 Chronic rhinitis: Secondary | ICD-10-CM | POA: Diagnosis not present

## 2017-06-22 DIAGNOSIS — J343 Hypertrophy of nasal turbinates: Secondary | ICD-10-CM | POA: Diagnosis not present

## 2017-06-26 ENCOUNTER — Other Ambulatory Visit (INDEPENDENT_AMBULATORY_CARE_PROVIDER_SITE_OTHER): Payer: Self-pay | Admitting: Otolaryngology

## 2017-06-26 DIAGNOSIS — J329 Chronic sinusitis, unspecified: Secondary | ICD-10-CM

## 2017-07-02 ENCOUNTER — Ambulatory Visit
Admission: RE | Admit: 2017-07-02 | Discharge: 2017-07-02 | Disposition: A | Discharge status: Home / Self Care | Payer: Medicare HMO | Source: Ambulatory Visit | Attending: Otolaryngology | Admitting: Otolaryngology

## 2017-07-02 DIAGNOSIS — J329 Chronic sinusitis, unspecified: Secondary | ICD-10-CM | POA: Diagnosis not present

## 2017-07-07 DIAGNOSIS — J343 Hypertrophy of nasal turbinates: Secondary | ICD-10-CM | POA: Diagnosis not present

## 2017-07-07 DIAGNOSIS — J310 Chronic rhinitis: Secondary | ICD-10-CM | POA: Diagnosis not present

## 2017-07-07 DIAGNOSIS — R51 Headache: Secondary | ICD-10-CM | POA: Diagnosis not present

## 2017-08-06 DIAGNOSIS — Z1159 Encounter for screening for other viral diseases: Secondary | ICD-10-CM | POA: Diagnosis not present

## 2017-08-06 DIAGNOSIS — E785 Hyperlipidemia, unspecified: Secondary | ICD-10-CM | POA: Diagnosis not present

## 2017-08-06 DIAGNOSIS — R69 Illness, unspecified: Secondary | ICD-10-CM | POA: Diagnosis not present

## 2017-08-06 DIAGNOSIS — Z79899 Other long term (current) drug therapy: Secondary | ICD-10-CM | POA: Diagnosis not present

## 2017-08-06 DIAGNOSIS — Z0000 Encounter for general adult medical examination without abnormal findings: Secondary | ICD-10-CM | POA: Diagnosis not present

## 2017-08-06 DIAGNOSIS — L304 Erythema intertrigo: Secondary | ICD-10-CM | POA: Diagnosis not present

## 2017-08-06 DIAGNOSIS — M4722 Other spondylosis with radiculopathy, cervical region: Secondary | ICD-10-CM | POA: Diagnosis not present

## 2017-08-06 DIAGNOSIS — G43019 Migraine without aura, intractable, without status migrainosus: Secondary | ICD-10-CM | POA: Diagnosis not present

## 2017-08-06 DIAGNOSIS — E559 Vitamin D deficiency, unspecified: Secondary | ICD-10-CM | POA: Diagnosis not present

## 2017-08-06 DIAGNOSIS — I10 Essential (primary) hypertension: Secondary | ICD-10-CM | POA: Diagnosis not present

## 2017-08-06 DIAGNOSIS — E042 Nontoxic multinodular goiter: Secondary | ICD-10-CM | POA: Diagnosis not present

## 2017-08-12 ENCOUNTER — Ambulatory Visit: Payer: Medicare HMO | Admitting: Neurology

## 2017-08-23 ENCOUNTER — Other Ambulatory Visit: Payer: Self-pay | Admitting: Family Medicine

## 2017-08-23 DIAGNOSIS — Z1231 Encounter for screening mammogram for malignant neoplasm of breast: Secondary | ICD-10-CM

## 2017-09-27 DIAGNOSIS — H4720 Unspecified optic atrophy: Secondary | ICD-10-CM | POA: Diagnosis not present

## 2017-09-27 DIAGNOSIS — H47213 Primary optic atrophy, bilateral: Secondary | ICD-10-CM | POA: Diagnosis not present

## 2017-09-27 DIAGNOSIS — H3589 Other specified retinal disorders: Secondary | ICD-10-CM | POA: Diagnosis not present

## 2017-09-27 DIAGNOSIS — H5340 Unspecified visual field defects: Secondary | ICD-10-CM | POA: Diagnosis not present

## 2017-09-27 DIAGNOSIS — H5213 Myopia, bilateral: Secondary | ICD-10-CM | POA: Diagnosis not present

## 2017-09-27 DIAGNOSIS — H547 Unspecified visual loss: Secondary | ICD-10-CM | POA: Diagnosis not present

## 2017-09-27 DIAGNOSIS — Z8669 Personal history of other diseases of the nervous system and sense organs: Secondary | ICD-10-CM | POA: Diagnosis not present

## 2017-10-01 ENCOUNTER — Ambulatory Visit
Admission: RE | Admit: 2017-10-01 | Discharge: 2017-10-01 | Disposition: A | Discharge status: Home / Self Care | Payer: Medicare HMO | Source: Ambulatory Visit | Attending: Family Medicine | Admitting: Family Medicine

## 2017-10-01 DIAGNOSIS — Z1231 Encounter for screening mammogram for malignant neoplasm of breast: Secondary | ICD-10-CM | POA: Diagnosis not present

## 2017-10-15 ENCOUNTER — Ambulatory Visit: Payer: Medicare HMO | Admitting: Neurology

## 2017-11-12 DIAGNOSIS — Z23 Encounter for immunization: Secondary | ICD-10-CM | POA: Diagnosis not present

## 2018-08-10 ENCOUNTER — Other Ambulatory Visit: Payer: Self-pay | Admitting: Family Medicine

## 2018-08-10 DIAGNOSIS — Z1231 Encounter for screening mammogram for malignant neoplasm of breast: Secondary | ICD-10-CM

## 2018-09-13 DIAGNOSIS — R7303 Prediabetes: Secondary | ICD-10-CM | POA: Diagnosis not present

## 2018-09-13 DIAGNOSIS — E559 Vitamin D deficiency, unspecified: Secondary | ICD-10-CM | POA: Diagnosis not present

## 2018-09-13 DIAGNOSIS — Z79899 Other long term (current) drug therapy: Secondary | ICD-10-CM | POA: Diagnosis not present

## 2018-09-13 DIAGNOSIS — I10 Essential (primary) hypertension: Secondary | ICD-10-CM | POA: Diagnosis not present

## 2018-09-13 DIAGNOSIS — G510 Bell's palsy: Secondary | ICD-10-CM | POA: Diagnosis not present

## 2018-09-13 DIAGNOSIS — E785 Hyperlipidemia, unspecified: Secondary | ICD-10-CM | POA: Diagnosis not present

## 2018-09-13 DIAGNOSIS — R69 Illness, unspecified: Secondary | ICD-10-CM | POA: Diagnosis not present

## 2018-09-13 DIAGNOSIS — E042 Nontoxic multinodular goiter: Secondary | ICD-10-CM | POA: Diagnosis not present

## 2018-09-13 DIAGNOSIS — Z0000 Encounter for general adult medical examination without abnormal findings: Secondary | ICD-10-CM | POA: Diagnosis not present

## 2018-09-13 DIAGNOSIS — J309 Allergic rhinitis, unspecified: Secondary | ICD-10-CM | POA: Diagnosis not present

## 2018-09-27 DIAGNOSIS — Z8669 Personal history of other diseases of the nervous system and sense organs: Secondary | ICD-10-CM | POA: Diagnosis not present

## 2018-09-27 DIAGNOSIS — H4720 Unspecified optic atrophy: Secondary | ICD-10-CM | POA: Diagnosis not present

## 2018-10-04 ENCOUNTER — Ambulatory Visit
Admission: RE | Admit: 2018-10-04 | Discharge: 2018-10-04 | Disposition: A | Discharge status: Home / Self Care | Payer: Medicare HMO | Source: Ambulatory Visit | Attending: Family Medicine | Admitting: Family Medicine

## 2018-10-04 ENCOUNTER — Other Ambulatory Visit: Payer: Self-pay

## 2018-10-04 DIAGNOSIS — Z1231 Encounter for screening mammogram for malignant neoplasm of breast: Secondary | ICD-10-CM

## 2018-10-25 DIAGNOSIS — Z23 Encounter for immunization: Secondary | ICD-10-CM | POA: Diagnosis not present

## 2018-11-23 ENCOUNTER — Other Ambulatory Visit: Payer: Self-pay | Admitting: Dermatology

## 2018-11-23 DIAGNOSIS — C44212 Basal cell carcinoma of skin of right ear and external auricular canal: Secondary | ICD-10-CM | POA: Diagnosis not present

## 2018-11-23 DIAGNOSIS — C44219 Basal cell carcinoma of skin of left ear and external auricular canal: Secondary | ICD-10-CM | POA: Diagnosis not present

## 2018-11-23 DIAGNOSIS — D229 Melanocytic nevi, unspecified: Secondary | ICD-10-CM | POA: Diagnosis not present

## 2018-11-23 DIAGNOSIS — L821 Other seborrheic keratosis: Secondary | ICD-10-CM | POA: Diagnosis not present

## 2019-02-01 DIAGNOSIS — C44219 Basal cell carcinoma of skin of left ear and external auricular canal: Secondary | ICD-10-CM | POA: Diagnosis not present

## 2019-02-15 DIAGNOSIS — C44212 Basal cell carcinoma of skin of right ear and external auricular canal: Secondary | ICD-10-CM | POA: Diagnosis not present

## 2019-03-22 ENCOUNTER — Ambulatory Visit: Payer: Medicare HMO | Attending: Internal Medicine

## 2019-03-22 DIAGNOSIS — Z20822 Contact with and (suspected) exposure to covid-19: Secondary | ICD-10-CM

## 2019-03-23 LAB — NOVEL CORONAVIRUS, NAA: SARS-CoV-2, NAA: NOT DETECTED

## 2019-03-24 ENCOUNTER — Telehealth: Payer: Self-pay

## 2019-03-24 NOTE — Telephone Encounter (Signed)
Pt notified of negative COVID-19 results. Understanding verbalized.  Stacy Hurst   

## 2019-04-04 DIAGNOSIS — J019 Acute sinusitis, unspecified: Secondary | ICD-10-CM | POA: Diagnosis not present

## 2019-05-02 ENCOUNTER — Ambulatory Visit: Payer: Medicare HMO | Admitting: Dermatology

## 2019-05-22 ENCOUNTER — Ambulatory Visit: Payer: Medicare HMO | Admitting: Dermatology

## 2019-06-06 ENCOUNTER — Other Ambulatory Visit: Payer: Self-pay | Admitting: Family Medicine

## 2019-06-06 DIAGNOSIS — Z1231 Encounter for screening mammogram for malignant neoplasm of breast: Secondary | ICD-10-CM

## 2019-07-20 DIAGNOSIS — J329 Chronic sinusitis, unspecified: Secondary | ICD-10-CM | POA: Diagnosis not present

## 2019-09-27 DIAGNOSIS — I10 Essential (primary) hypertension: Secondary | ICD-10-CM | POA: Diagnosis not present

## 2019-09-27 DIAGNOSIS — E785 Hyperlipidemia, unspecified: Secondary | ICD-10-CM | POA: Diagnosis not present

## 2019-09-27 DIAGNOSIS — R7303 Prediabetes: Secondary | ICD-10-CM | POA: Diagnosis not present

## 2019-09-27 DIAGNOSIS — G510 Bell's palsy: Secondary | ICD-10-CM | POA: Diagnosis not present

## 2019-09-27 DIAGNOSIS — Z79899 Other long term (current) drug therapy: Secondary | ICD-10-CM | POA: Diagnosis not present

## 2019-09-27 DIAGNOSIS — R7309 Other abnormal glucose: Secondary | ICD-10-CM | POA: Diagnosis not present

## 2019-09-27 DIAGNOSIS — E559 Vitamin D deficiency, unspecified: Secondary | ICD-10-CM | POA: Diagnosis not present

## 2019-09-27 DIAGNOSIS — Z0000 Encounter for general adult medical examination without abnormal findings: Secondary | ICD-10-CM | POA: Diagnosis not present

## 2019-09-27 DIAGNOSIS — R69 Illness, unspecified: Secondary | ICD-10-CM | POA: Diagnosis not present

## 2019-09-27 DIAGNOSIS — J329 Chronic sinusitis, unspecified: Secondary | ICD-10-CM | POA: Diagnosis not present

## 2019-09-27 DIAGNOSIS — E042 Nontoxic multinodular goiter: Secondary | ICD-10-CM | POA: Diagnosis not present

## 2019-09-29 DIAGNOSIS — H4720 Unspecified optic atrophy: Secondary | ICD-10-CM | POA: Diagnosis not present

## 2019-09-29 DIAGNOSIS — Z8669 Personal history of other diseases of the nervous system and sense organs: Secondary | ICD-10-CM | POA: Diagnosis not present

## 2019-10-05 ENCOUNTER — Ambulatory Visit
Admission: RE | Admit: 2019-10-05 | Discharge: 2019-10-05 | Disposition: A | Discharge status: Home / Self Care | Payer: Medicare HMO | Source: Ambulatory Visit | Attending: Family Medicine | Admitting: Family Medicine

## 2019-10-05 ENCOUNTER — Other Ambulatory Visit: Payer: Self-pay

## 2019-10-05 DIAGNOSIS — Z1231 Encounter for screening mammogram for malignant neoplasm of breast: Secondary | ICD-10-CM | POA: Diagnosis not present

## 2019-11-02 DIAGNOSIS — Z23 Encounter for immunization: Secondary | ICD-10-CM | POA: Diagnosis not present

## 2020-04-09 DIAGNOSIS — J019 Acute sinusitis, unspecified: Secondary | ICD-10-CM | POA: Diagnosis not present

## 2020-04-11 ENCOUNTER — Other Ambulatory Visit: Payer: Medicare HMO

## 2020-04-11 DIAGNOSIS — Z20822 Contact with and (suspected) exposure to covid-19: Secondary | ICD-10-CM | POA: Diagnosis not present

## 2020-04-12 LAB — SPECIMEN STATUS REPORT

## 2020-04-12 LAB — NOVEL CORONAVIRUS, NAA: SARS-CoV-2, NAA: NOT DETECTED

## 2020-04-12 LAB — SARS-COV-2, NAA 2 DAY TAT

## 2020-06-17 ENCOUNTER — Other Ambulatory Visit: Payer: Self-pay | Admitting: Family Medicine

## 2020-06-17 DIAGNOSIS — Z1231 Encounter for screening mammogram for malignant neoplasm of breast: Secondary | ICD-10-CM

## 2020-10-09 ENCOUNTER — Other Ambulatory Visit: Payer: Self-pay

## 2020-10-09 ENCOUNTER — Ambulatory Visit
Admission: RE | Admit: 2020-10-09 | Discharge: 2020-10-09 | Disposition: A | Discharge status: Home / Self Care | Payer: Medicare HMO | Source: Ambulatory Visit | Attending: Family Medicine | Admitting: Family Medicine

## 2020-10-09 DIAGNOSIS — Z1231 Encounter for screening mammogram for malignant neoplasm of breast: Secondary | ICD-10-CM

## 2020-10-14 ENCOUNTER — Other Ambulatory Visit (HOSPITAL_COMMUNITY)
Admission: RE | Admit: 2020-10-14 | Discharge: 2020-10-14 | Disposition: A | Discharge status: Home / Self Care | Payer: Medicare HMO | Source: Ambulatory Visit | Attending: Family Medicine | Admitting: Family Medicine

## 2020-10-14 DIAGNOSIS — Z0000 Encounter for general adult medical examination without abnormal findings: Secondary | ICD-10-CM | POA: Insufficient documentation

## 2020-10-14 DIAGNOSIS — Z01419 Encounter for gynecological examination (general) (routine) without abnormal findings: Secondary | ICD-10-CM | POA: Diagnosis present

## 2020-10-14 DIAGNOSIS — Z23 Encounter for immunization: Secondary | ICD-10-CM | POA: Diagnosis not present

## 2020-10-14 DIAGNOSIS — I10 Essential (primary) hypertension: Secondary | ICD-10-CM | POA: Diagnosis not present

## 2020-10-14 DIAGNOSIS — G43019 Migraine without aura, intractable, without status migrainosus: Secondary | ICD-10-CM | POA: Diagnosis not present

## 2020-10-14 DIAGNOSIS — E785 Hyperlipidemia, unspecified: Secondary | ICD-10-CM | POA: Diagnosis not present

## 2020-10-14 DIAGNOSIS — Z1151 Encounter for screening for human papillomavirus (HPV): Secondary | ICD-10-CM | POA: Diagnosis not present

## 2020-10-14 DIAGNOSIS — R69 Illness, unspecified: Secondary | ICD-10-CM | POA: Diagnosis not present

## 2020-10-14 DIAGNOSIS — R7303 Prediabetes: Secondary | ICD-10-CM | POA: Diagnosis not present

## 2020-10-14 DIAGNOSIS — N183 Chronic kidney disease, stage 3 unspecified: Secondary | ICD-10-CM | POA: Diagnosis not present

## 2020-10-14 DIAGNOSIS — E042 Nontoxic multinodular goiter: Secondary | ICD-10-CM | POA: Diagnosis not present

## 2020-10-14 DIAGNOSIS — Z79899 Other long term (current) drug therapy: Secondary | ICD-10-CM | POA: Diagnosis not present

## 2020-10-21 LAB — CYTOLOGY - PAP
Adequacy: ABSENT
Comment: NEGATIVE
Diagnosis: NEGATIVE
High risk HPV: NEGATIVE

## 2020-11-21 DIAGNOSIS — Z1211 Encounter for screening for malignant neoplasm of colon: Secondary | ICD-10-CM | POA: Diagnosis not present

## 2021-07-22 DIAGNOSIS — Z8669 Personal history of other diseases of the nervous system and sense organs: Secondary | ICD-10-CM | POA: Diagnosis not present

## 2021-07-22 DIAGNOSIS — H4720 Unspecified optic atrophy: Secondary | ICD-10-CM | POA: Diagnosis not present

## 2021-09-15 ENCOUNTER — Other Ambulatory Visit: Payer: Self-pay | Admitting: Family Medicine

## 2021-09-15 DIAGNOSIS — Z1231 Encounter for screening mammogram for malignant neoplasm of breast: Secondary | ICD-10-CM

## 2021-10-10 ENCOUNTER — Ambulatory Visit
Admission: RE | Admit: 2021-10-10 | Discharge: 2021-10-10 | Disposition: A | Discharge status: Home / Self Care | Payer: Medicare HMO | Source: Ambulatory Visit | Attending: Family Medicine | Admitting: Family Medicine

## 2021-10-10 DIAGNOSIS — Z1231 Encounter for screening mammogram for malignant neoplasm of breast: Secondary | ICD-10-CM | POA: Diagnosis not present

## 2021-12-05 DIAGNOSIS — E042 Nontoxic multinodular goiter: Secondary | ICD-10-CM | POA: Diagnosis not present

## 2021-12-05 DIAGNOSIS — G43019 Migraine without aura, intractable, without status migrainosus: Secondary | ICD-10-CM | POA: Diagnosis not present

## 2021-12-05 DIAGNOSIS — Z79899 Other long term (current) drug therapy: Secondary | ICD-10-CM | POA: Diagnosis not present

## 2021-12-05 DIAGNOSIS — H540X33 Blindness right eye category 3, blindness left eye category 3: Secondary | ICD-10-CM | POA: Diagnosis not present

## 2021-12-05 DIAGNOSIS — R69 Illness, unspecified: Secondary | ICD-10-CM | POA: Diagnosis not present

## 2021-12-05 DIAGNOSIS — R7303 Prediabetes: Secondary | ICD-10-CM | POA: Diagnosis not present

## 2021-12-05 DIAGNOSIS — Z23 Encounter for immunization: Secondary | ICD-10-CM | POA: Diagnosis not present

## 2021-12-05 DIAGNOSIS — E559 Vitamin D deficiency, unspecified: Secondary | ICD-10-CM | POA: Diagnosis not present

## 2021-12-05 DIAGNOSIS — E785 Hyperlipidemia, unspecified: Secondary | ICD-10-CM | POA: Diagnosis not present

## 2021-12-05 DIAGNOSIS — I10 Essential (primary) hypertension: Secondary | ICD-10-CM | POA: Diagnosis not present

## 2021-12-05 DIAGNOSIS — N1831 Chronic kidney disease, stage 3a: Secondary | ICD-10-CM | POA: Diagnosis not present

## 2021-12-05 DIAGNOSIS — Z0000 Encounter for general adult medical examination without abnormal findings: Secondary | ICD-10-CM | POA: Diagnosis not present

## 2022-09-07 ENCOUNTER — Other Ambulatory Visit: Payer: Self-pay | Admitting: Family Medicine

## 2022-09-07 DIAGNOSIS — Z1231 Encounter for screening mammogram for malignant neoplasm of breast: Secondary | ICD-10-CM

## 2022-10-16 ENCOUNTER — Ambulatory Visit
Admission: RE | Admit: 2022-10-16 | Discharge: 2022-10-16 | Disposition: A | Discharge status: Home / Self Care | Payer: Medicare HMO | Source: Ambulatory Visit | Attending: Family Medicine | Admitting: Family Medicine

## 2022-10-16 DIAGNOSIS — Z1231 Encounter for screening mammogram for malignant neoplasm of breast: Secondary | ICD-10-CM | POA: Diagnosis not present

## 2023-07-09 DIAGNOSIS — J019 Acute sinusitis, unspecified: Secondary | ICD-10-CM | POA: Diagnosis not present

## 2023-09-20 ENCOUNTER — Other Ambulatory Visit: Payer: Self-pay | Admitting: Family Medicine

## 2023-09-20 DIAGNOSIS — Z1231 Encounter for screening mammogram for malignant neoplasm of breast: Secondary | ICD-10-CM

## 2023-10-18 ENCOUNTER — Ambulatory Visit
Admission: RE | Admit: 2023-10-18 | Discharge: 2023-10-18 | Disposition: A | Discharge status: Home / Self Care | Source: Ambulatory Visit | Attending: Family Medicine | Admitting: Family Medicine

## 2023-10-18 DIAGNOSIS — Z1231 Encounter for screening mammogram for malignant neoplasm of breast: Secondary | ICD-10-CM

## 2023-11-11 ENCOUNTER — Other Ambulatory Visit: Payer: Self-pay | Admitting: Gastroenterology

## 2023-11-25 LAB — COLOGUARD

## 2023-12-02 ENCOUNTER — Encounter (HOSPITAL_COMMUNITY): Payer: Self-pay | Admitting: Gastroenterology

## 2023-12-02 NOTE — Progress Notes (Signed)
 Attempted to obtain medical history via telephone, unable to reach at this time. Unable to leave voicemail to return pre surgical testing department's phone call,due to mailbox not set up.

## 2023-12-07 NOTE — Anesthesia Preprocedure Evaluation (Signed)
 Anesthesia Evaluation  Patient identified by MRN, date of birth, ID band  Reviewed: Allergy & Precautions, NPO status , Patient's Chart, lab work & pertinent test results  History of Anesthesia Complications Negative for: history of anesthetic complications  Airway Mallampati: II  TM Distance: >3 FB Neck ROM: Full    Dental  (+) Teeth Intact, Dental Advisory Given   Pulmonary    breath sounds clear to auscultation       Cardiovascular hypertension, Pt. on medications  Rhythm:Regular Rate:Normal  TTE 2015: - Left ventricle: The cavity size was normal. Systolic function was    normal. The estimated ejection fraction was in the range of 55%    to 60%. Wall motion was normal; there were no regional wall    motion abnormalities. Doppler parameters are consistent with    abnormal left ventricular relaxation (grade 1 diastolic    dysfunction).  - Atrial septum:     Neuro/Psych  Headaches VP shunt 2/2 Hydrocephalus; Legally Blind    GI/Hepatic   Endo/Other  Diabetes: Pre-Diabetes.    Renal/GU      Musculoskeletal   Abdominal   Peds  Hematology   Anesthesia Other Findings   Reproductive/Obstetrics                              Anesthesia Physical Anesthesia Plan  ASA: 3  Anesthesia Plan: MAC   Post-op Pain Management:    Induction: Intravenous  PONV Risk Score and Plan: 1 and Propofol infusion and Treatment may vary due to age or medical condition  Airway Management Planned: Simple Face Mask  Additional Equipment:   Intra-op Plan:   Post-operative Plan:   Informed Consent:      Dental advisory given  Plan Discussed with: CRNA and Surgeon  Anesthesia Plan Comments:          Anesthesia Quick Evaluation

## 2023-12-07 NOTE — H&P (Signed)
 HPI: 62/female for a screening colonoscopy.  Current Medications Advil 200 MG Tablet 2 tablets Orally as needed Alka-Seltzer Extra Strength 500 MG Tablet Effervescent 1 tablet dissolved in 4 ounces of water as needed Orally Three times a day Aspir-81 81 MG Tablet Delayed Release 1 tablet Orally Once a day Atenolol  50 MG Tablet 1 tablet Orally Once a day Bioflex(Bioflavonoid Products) - Tablet as directed Orally daily Calcium + D 600mg /?vit d iu Tablet 2 tablets Orally once a day Centrum Tablet 1 tablet Orally once a day Crestor(Rosuvastatin Calcium) 10 MG Tablet 1 tablet Orally Once a day Diflucan 150 MG Tablet 1 tablet Orally single dose, may repeat in 3-5 days as needed Diflucan 150 MG Tablet 1 tablet Orally single dose, may repeat in 3-5 days as needed Fish Oil 1000 MG Capsule 4 capsules with a meal Orally Once a day FLUoxetine HCl 40 MG Capsule 1 capsule Orally Once a day Glucosamine Chondr 1500 Complx(Glucosamine-Chondroit-Vit C-Mn) Capsule 2 tablets Orally once a day hydroCHLOROthiazide 25 MG Tablet 1 tablet Orally Once a day Klor-Con M20(Potassium Chloride Crys ER) 20 MEQ Tablet Extended Release 1 tablet with food Orally Once a day Levocetirizine Dihydrochloride 5 MG Tablet 1 tablet in the evening Orally Once a day Lysine 1000 MG Tablet as directed Orally daily Montelukast Sodium 10 MG Tablet 1 tablet Orally Once a day Nasonex 50 MCG/ACT Suspension 2 sprays in each nostril Nasally twice a day As needed Patanase 0.6 % Solution 2 sprays in each nostril Nasally Twice a day As needed Pepto Bismol(Bismuth Subsalicylate) 262 MG Capsule 1 capsule as needed Orally 8 time(s) a day SUMAtriptan Succinate 100 MG Tablet 1 tablet Orally once a day if needed Tums ultra 1000mg  Tablet Chewable 2 tablets Orally twice a day as needed Tylenol 8 Hour(Acetaminophen ER) 650 MG Tablet Extended Release 2 tablets as needed Orally every 8 hrs Vitamin C 1000 MG Tablet 1 tablet Orally Once a day Vitamin D 50  MCG (2000 UT) Capsule 1 capsule Orally Once a day Wellbutrin XL 300 MG Tablet Extended Release 24 Hour 1 tablet every morning Orally Once a day   Past Medical History Hypertension. migraine headaches, Dr. Arlys > Dr. Georjean. R VP shunt for hydrocephalus - 1966, since revised by Dr. Gaither 1994 (left sided VP shunt)/Dr. Powers [facility] Neuro[facility]urg). depression/anxiety-[facility], ESA for her cats. Legally blind - bilateral. hypercholesterolemia-goal LDL < 130 (dx 03/30/2008). vitamin D deficiency- DX 2010. left 7th nerve palsy, Dr. Gladis, [facility]. ORTHO: Dr. Lucious. Multinodular goiter, dominant 3 cms in the right 08/2013 > benign biopsy. left carpal tunnel syndrome, Dr. Tressia (does not want to go back to see him). recurrent sinusitis, Dr. Karis (normal CT sinuses 2010/2015/2019). Prediabetes. HCV ab neg 2018. DERM: Dr. Livingston. COVID 11/2021. Bell's palsy. Hydrocephalus. CKD3. Basal cell carcinoma behind both ears.  Surgical History VP shunt-Dr Gaither x 2 01/1993, 1965 cranial surgery 06/1993 right ankle surgery x 2 (2008,2009)- broken rotator cuff tear repair left 03/2010 Basal Cell removed- Left ear/ right ear - Feb 15, 2019 01/2019 Skin tags removed  Family History Father: deceased 55 yrs, COPD, , congested heart failure, diagnosed with CHF (congestive heart failure) Mother: alive 42 yrs, Hypertension, depression, Boderline diabetes, diagnosed with Hypertension, Diabetes Brother 1: alive 56 yrs, , Hypercholesterolemia Sister 1: alive 52 yrs, A+W Sister 2: alive 64 yrs, migraines, HTN, hypercholesterolemia, diagnosed with Hypertension 1 brother(s) , 2 sister(s) . No known direct family hx of colon cx, colon polyps or liver disease  per pt.  Social History    General:  Tobacco use  cigarettes: Never smoked Tobacco history last updated 09/22/2023 Vaping No EXPOSURE TO PASSIVE SMOKE: no. Alcohol: no. Caffeine: yes, 5 diet coke cans daily &  unsweet tea but adds splenda drinks about 24 oz daily. Recreational drug use: no, never. DIET: regular. Exercise: yes, walking a little. Marital Status: single. Children: none. OCCUPATION: disabled. Miscellaneous: does not drive.  Allergies AMOXICILLIN: Lack of Therapeutic Effect doxycycline: Lack of Therapeutic Effect Zithromax Z-Pak: Lack of Therapeutic Effect Ibuprofen: (high dose cause) stomach burning/nausea - Side Effects Zofran : constipation - Side Effects Sulfa Antibiotics: rash and headaches - Allergy Hospitalization/Major Diagnostic Procedure see surg hx Not in the past year 09/2023  Vital Signs Wt: 299.0, Wt change: -3.6 lbs, Ht: 62.5, BMI: 53.81. 08/20 pt weighed in office. Assessments 1. Colon cancer screening - Z12.11 (Primary)    Treatment 1. Colon cancer screening      IMAGING: Colonoscopy

## 2023-12-08 ENCOUNTER — Ambulatory Visit (HOSPITAL_BASED_OUTPATIENT_CLINIC_OR_DEPARTMENT_OTHER): Payer: Self-pay

## 2023-12-08 ENCOUNTER — Encounter (HOSPITAL_COMMUNITY): Payer: Self-pay | Admitting: Gastroenterology

## 2023-12-08 ENCOUNTER — Encounter (HOSPITAL_COMMUNITY)
Admission: RE | Disposition: A | Discharge status: Home / Self Care | Payer: Self-pay | Source: Home / Self Care | Attending: Gastroenterology

## 2023-12-08 ENCOUNTER — Ambulatory Visit (HOSPITAL_COMMUNITY)
Admission: RE | Admit: 2023-12-08 | Discharge: 2023-12-08 | Disposition: A | Discharge status: Home / Self Care | Attending: Gastroenterology | Admitting: Gastroenterology

## 2023-12-08 ENCOUNTER — Ambulatory Visit (HOSPITAL_COMMUNITY): Payer: Self-pay

## 2023-12-08 ENCOUNTER — Other Ambulatory Visit: Payer: Self-pay

## 2023-12-08 DIAGNOSIS — I10 Essential (primary) hypertension: Secondary | ICD-10-CM | POA: Insufficient documentation

## 2023-12-08 DIAGNOSIS — E119 Type 2 diabetes mellitus without complications: Secondary | ICD-10-CM

## 2023-12-08 DIAGNOSIS — R7303 Prediabetes: Secondary | ICD-10-CM | POA: Insufficient documentation

## 2023-12-08 DIAGNOSIS — Z1211 Encounter for screening for malignant neoplasm of colon: Secondary | ICD-10-CM

## 2023-12-08 DIAGNOSIS — K648 Other hemorrhoids: Secondary | ICD-10-CM | POA: Insufficient documentation

## 2023-12-08 DIAGNOSIS — H548 Legal blindness, as defined in USA: Secondary | ICD-10-CM | POA: Diagnosis not present

## 2023-12-08 DIAGNOSIS — K6289 Other specified diseases of anus and rectum: Secondary | ICD-10-CM | POA: Diagnosis not present

## 2023-12-08 HISTORY — PX: COLONOSCOPY: SHX5424

## 2023-12-08 SURGERY — COLONOSCOPY
Anesthesia: Monitor Anesthesia Care

## 2023-12-08 MED ORDER — LIDOCAINE 2% (20 MG/ML) 5 ML SYRINGE
INTRAMUSCULAR | Status: DC | PRN
  Administered 2023-12-08: 80 mg via INTRAVENOUS

## 2023-12-08 MED ORDER — SODIUM CHLORIDE 0.9 % IV SOLN
INTRAVENOUS | Status: AC | PRN
Start: 2023-12-08 — End: 2023-12-08
  Administered 2023-12-08: 500 mL via INTRAVENOUS

## 2023-12-08 MED ORDER — PHENYLEPHRINE HCL (PRESSORS) 10 MG/ML IV SOLN
INTRAVENOUS | Status: DC | PRN
  Administered 2023-12-08: 100 ug via INTRAVENOUS

## 2023-12-08 MED ORDER — PROPOFOL 500 MG/50ML IV EMUL
INTRAVENOUS | Status: DC | PRN
  Administered 2023-12-08: 100 ug/kg/min via INTRAVENOUS

## 2023-12-08 MED ORDER — SODIUM CHLORIDE 0.9 % IV SOLN: INTRAVENOUS | Status: DC

## 2023-12-08 MED ORDER — PROPOFOL 10 MG/ML IV BOLUS
INTRAVENOUS | Status: DC | PRN
  Administered 2023-12-08: 20 mg via INTRAVENOUS
  Administered 2023-12-08: 100 mg via INTRAVENOUS
  Administered 2023-12-08 (×2): 20 mg via INTRAVENOUS

## 2023-12-08 NOTE — Interval H&P Note (Signed)
 History and Physical Interval Note: 62/female for a screening colonoscopy with propofol.  12/08/2023 9:28 AM  Nathanel Helling Mara  has presented today for colonoscopy with propofol, with the diagnosis of Z12.11 Colon cancer screening.  The various methods of treatment have been discussed with the patient and family. After consideration of risks, benefits and other options for treatment, the patient has consented to  Procedure(s): COLONOSCOPY (N/A) as a surgical intervention.  The patient's history has been reviewed, patient examined, no change in status, stable for surgery.  I have reviewed the patient's chart and labs.  Questions were answered to the patient's satisfaction.     Estelita Manas

## 2023-12-08 NOTE — Op Note (Signed)
 [facility] Patient Name: Stacy Hurst Procedure Date: 12/08/2023 MRN: [id number] Attending MD: Estelita Manas , MD, [id number] Date of Birth: Sep 07, 1960 CSN: [id number] Age: 63 Admit Type: Ambulatory Procedure:                Colonoscopy Indications:              Screening for colorectal malignant neoplasm, Last                            colonoscopy 10 years ago Providers:                Estelita Manas, MD, Randall Lines, RN, Fairy [place],                            Technician Referring MD:             Barabara Dama, DO Medicines:                Monitored Anesthesia Care Complications:            No immediate complications. Estimated Blood Loss:     Estimated blood loss: none. Procedure:                Pre-Anesthesia Assessment:                           - Prior to the procedure, a History and Physical                            was performed, and patient medications and                            allergies were reviewed. The patient's tolerance of                            previous anesthesia was also reviewed. The risks                            and benefits of the procedure and the sedation                            options and risks were discussed with the patient.                            All questions were answered, and informed consent                            was obtained. Prior Anticoagulants: The patient has                            taken no anticoagulant or antiplatelet agents. ASA                            Grade Assessment: III - A patient with severe  systemic disease. After reviewing the risks and                            benefits, the patient was deemed in satisfactory                            condition to undergo the procedure.                           After obtaining informed consent, the colonoscope                            was passed under direct vision. Throughout the                            procedure,  the patient's blood pressure, pulse, and                            oxygen saturations were monitored continuously. The                            CF-HQ190L ([id number]) Olympus colonoscope was                            introduced through the anus and advanced to the the                            cecum, identified by appendiceal orifice and                            ileocecal valve. The ileocecal valve, appendiceal                            orifice, and rectum were photographed. Scope In: 10:18:35 AM Scope Out: 10:34:16 AM Scope Withdrawal Time: 0 hours 13 minutes 7 seconds  Total Procedure Duration: 0 hours 15 minutes 41 seconds  Findings:      The perianal exam findings showed a few linear superficial skin tears.      The colon (entire examined portion) appeared normal.      Non-bleeding internal hemorrhoids were found during retroflexion.      Anal papilla(e) were hypertrophied. Impression:               - Few linear superficial skin tears were noted.                            found on perianal exam.                           - The entire examined colon is normal.                           - Non-bleeding internal hemorrhoids.                           - Anal papilla(e) were hypertrophied.                           -  No specimens collected. Moderate Sedation:      Patient did not receive moderate sedation for this procedure, but       instead received monitored anesthesia care. Recommendation:           - Patient has a contact number available for                            emergencies. The signs and symptoms of potential                            delayed complications were discussed with the                            patient. Return to normal activities tomorrow.                            Written discharge instructions were provided to the                            patient.                           - Resume regular diet.                           - Repeat colonoscopy in 10  years for screening                            purposes. Procedure Code(s):        --- Professional ---                           H9878, Colorectal cancer screening; colonoscopy on                            individual not meeting criteria for high risk Diagnosis Code(s):        --- Professional ---                           Z12.11, Encounter for screening for malignant                            neoplasm of colon                           K64.8, Other hemorrhoids                           K62.89, Other specified diseases of anus and rectum CPT copyright 2022 American Medical Association. All rights reserved. The codes documented in this report are preliminary and upon coder review may  be revised to meet current compliance requirements. Estelita Manas, MD 12/08/2023 10:42:07 AM This report has been signed electronically. Number of Addenda: 0

## 2023-12-08 NOTE — Discharge Instructions (Signed)

## 2023-12-08 NOTE — Anesthesia Postprocedure Evaluation (Signed)
 Anesthesia Post Note  Patient: Stacy Hurst  Procedure(s) Performed: COLONOSCOPY     Patient location during evaluation: Endoscopy Anesthesia Type: MAC Level of consciousness: awake Pain management: pain level controlled Vital Signs Assessment: post-procedure vital signs reviewed and stable Respiratory status: spontaneous breathing Cardiovascular status: blood pressure returned to baseline Postop Assessment: no apparent nausea or vomiting Anesthetic complications: no   No notable events documented.  Last Vitals:  Vitals:   12/08/23 1050 12/08/23 1055  BP: 111/62 107/63  Pulse: 87 81  Resp: 18 16  Temp:    SpO2: 95% 98%    Last Pain:  Vitals:   12/08/23 1055  TempSrc:   PainSc: 0-No pain                 Lauraine KATHEE [place]

## 2023-12-08 NOTE — Transfer of Care (Signed)
 Immediate Anesthesia Transfer of Care Note  Patient: Stacy Hurst  Procedure(s) Performed: COLONOSCOPY  Patient Location: PACU  Anesthesia Type:MAC  Level of Consciousness: awake, alert , oriented, and patient cooperative  Airway & Oxygen Therapy: Patient Spontanous Breathing and Patient connected to face mask oxygen  Post-op Assessment: Report given to RN and Post -op Vital signs reviewed and stable  Post vital signs: Reviewed and stable  Last Vitals:  Vitals Value Taken Time  BP 108/48 12/08/23 10:37  Temp    Pulse 80 12/08/23 10:39  Resp 14 12/08/23 10:39  SpO2 98 % 12/08/23 10:39  Vitals shown include unfiled device data.  Last Pain:  Vitals:   12/08/23 0922  TempSrc: Temporal  PainSc: 0-No pain         Complications: No notable events documented.

## 2023-12-09 ENCOUNTER — Encounter (HOSPITAL_COMMUNITY): Payer: Self-pay | Admitting: Gastroenterology

## 2023-12-10 DIAGNOSIS — Z23 Encounter for immunization: Secondary | ICD-10-CM | POA: Diagnosis not present

## 2023-12-10 DIAGNOSIS — Z0000 Encounter for general adult medical examination without abnormal findings: Secondary | ICD-10-CM | POA: Diagnosis not present

## 2023-12-10 DIAGNOSIS — E785 Hyperlipidemia, unspecified: Secondary | ICD-10-CM | POA: Diagnosis not present

## 2023-12-10 DIAGNOSIS — E042 Nontoxic multinodular goiter: Secondary | ICD-10-CM | POA: Diagnosis not present

## 2023-12-10 DIAGNOSIS — F330 Major depressive disorder, recurrent, mild: Secondary | ICD-10-CM | POA: Diagnosis not present

## 2023-12-10 DIAGNOSIS — R7303 Prediabetes: Secondary | ICD-10-CM | POA: Diagnosis not present

## 2023-12-10 DIAGNOSIS — N1831 Chronic kidney disease, stage 3a: Secondary | ICD-10-CM | POA: Diagnosis not present

## 2024-01-17 DIAGNOSIS — J069 Acute upper respiratory infection, unspecified: Secondary | ICD-10-CM | POA: Diagnosis not present
